# Patient Record
Sex: Female | Born: 1965
Health system: Southern US, Community
[De-identification: ages and names within clinical notes are randomized; demographics above are authoritative.]

## PROBLEM LIST (undated history)

## (undated) DIAGNOSIS — E781 Pure hyperglyceridemia: Secondary | ICD-10-CM

## (undated) DIAGNOSIS — M542 Cervicalgia: Secondary | ICD-10-CM

## (undated) DIAGNOSIS — E7800 Pure hypercholesterolemia, unspecified: Secondary | ICD-10-CM

## (undated) DIAGNOSIS — M1990 Unspecified osteoarthritis, unspecified site: Secondary | ICD-10-CM

## (undated) HISTORY — PX: ENDOMETRIAL ABLATION: SHX621

## (undated) HISTORY — DX: Pure hyperglyceridemia: E78.1

## (undated) HISTORY — DX: Pure hypercholesterolemia, unspecified: E78.00

## (undated) HISTORY — PX: AUGMENTATION MAMMAPLASTY: SUR837

## (undated) HISTORY — PX: TUBAL LIGATION: SHX77

## (undated) HISTORY — DX: Cervicalgia: M54.2

---

## 1994-01-18 HISTORY — PX: BREAST SURGERY: SHX581

## 2000-07-11 ENCOUNTER — Ambulatory Visit (HOSPITAL_COMMUNITY): Admission: RE | Admit: 2000-07-11 | Discharge: 2000-07-11 | Payer: Self-pay | Admitting: Obstetrics and Gynecology

## 2003-01-08 ENCOUNTER — Other Ambulatory Visit: Admission: RE | Admit: 2003-01-08 | Discharge: 2003-01-08 | Payer: Self-pay | Admitting: Obstetrics and Gynecology

## 2003-10-23 ENCOUNTER — Encounter: Admission: RE | Admit: 2003-10-23 | Discharge: 2003-10-23 | Payer: Self-pay | Admitting: Obstetrics and Gynecology

## 2004-01-21 ENCOUNTER — Other Ambulatory Visit: Admission: RE | Admit: 2004-01-21 | Discharge: 2004-01-21 | Payer: Self-pay | Admitting: Obstetrics and Gynecology

## 2004-04-20 ENCOUNTER — Encounter: Admission: RE | Admit: 2004-04-20 | Discharge: 2004-04-20 | Payer: Self-pay | Admitting: Obstetrics and Gynecology

## 2008-01-26 ENCOUNTER — Encounter: Payer: Self-pay | Admitting: Obstetrics and Gynecology

## 2008-01-26 ENCOUNTER — Ambulatory Visit: Payer: Self-pay | Admitting: Obstetrics and Gynecology

## 2008-01-26 ENCOUNTER — Other Ambulatory Visit: Admission: RE | Admit: 2008-01-26 | Discharge: 2008-01-26 | Payer: Self-pay | Admitting: Obstetrics and Gynecology

## 2008-02-06 ENCOUNTER — Encounter: Admission: RE | Admit: 2008-02-06 | Discharge: 2008-02-06 | Payer: Self-pay | Admitting: Obstetrics and Gynecology

## 2008-02-06 ENCOUNTER — Ambulatory Visit: Payer: Self-pay | Admitting: Obstetrics and Gynecology

## 2008-02-08 ENCOUNTER — Ambulatory Visit: Payer: Self-pay | Admitting: Obstetrics and Gynecology

## 2008-02-08 ENCOUNTER — Ambulatory Visit (HOSPITAL_BASED_OUTPATIENT_CLINIC_OR_DEPARTMENT_OTHER): Admission: RE | Admit: 2008-02-08 | Discharge: 2008-02-08 | Payer: Self-pay | Admitting: Obstetrics and Gynecology

## 2008-02-16 ENCOUNTER — Ambulatory Visit: Payer: Self-pay | Admitting: Obstetrics and Gynecology

## 2008-03-05 ENCOUNTER — Encounter: Admission: RE | Admit: 2008-03-05 | Discharge: 2008-03-05 | Payer: Self-pay | Admitting: Obstetrics and Gynecology

## 2008-12-03 ENCOUNTER — Ambulatory Visit: Payer: Self-pay | Admitting: Obstetrics and Gynecology

## 2008-12-26 ENCOUNTER — Ambulatory Visit: Payer: Self-pay | Admitting: Obstetrics and Gynecology

## 2009-01-09 ENCOUNTER — Ambulatory Visit: Payer: Self-pay | Admitting: Obstetrics and Gynecology

## 2009-01-29 ENCOUNTER — Ambulatory Visit: Payer: Self-pay | Admitting: Obstetrics and Gynecology

## 2009-03-25 ENCOUNTER — Ambulatory Visit: Payer: Self-pay | Admitting: Obstetrics and Gynecology

## 2009-03-25 ENCOUNTER — Other Ambulatory Visit: Admission: RE | Admit: 2009-03-25 | Discharge: 2009-03-25 | Payer: Self-pay | Admitting: Obstetrics and Gynecology

## 2009-07-07 ENCOUNTER — Encounter: Admission: RE | Admit: 2009-07-07 | Discharge: 2009-07-07 | Payer: Self-pay | Admitting: Obstetrics and Gynecology

## 2010-06-02 NOTE — Op Note (Signed)
NAMEJONIAH, Mary Watson                  ACCOUNT NO.:  [id number]   MEDICAL RECORD NO.:  [id number]          PATIENT TYPE:  AMB   LOCATION:  [facility]                         FACILITY:  [facility]   PHYSICIAN:  Daniel L. Gottsegen, M.D.DATE OF BIRTH:  04-Feb-1965   DATE OF PROCEDURE:  02/08/2008  DATE OF DISCHARGE:                               OPERATIVE REPORT   PREOPERATIVE DIAGNOSIS:  A right Bartholin cyst.   POSTOPERATIVE DIAGNOSIS:  A right Bartholin cyst.   OPERATIONS:  Marsupialization of right Bartholin cyst.   SURGEON:  Daniel L. Eda Paschal, M.D.   ANESTHESIA:  General.   INDICATIONS:  Patient is a 45 year old female who had presented to the  office with a 50-month history of a swelling of her right labia which was  very uncomfortable.  On presentation, it is a Bartholin cyst.  There was  no evidence of any skin cellulitis.  She enters the hospital now for  marsupialization of the above.   FINDINGS:  External exam is normal.  B U S reveals a right Bartholin  cyst of about 4 to 5 cm.  Vagina is normal.  Cervix is clean.  Uterus is  normal size and shape.  Adnexa failed to reveal masses.   PROCEDURE:  After adequate general anesthesia, the patient was placed in  the dorsal lithotomy position and prepped and draped in the usual  sterile manner.  At the mucocutaneous junction, an incision was made  over the Bartholin cyst and cyst fluid drained without any evidence of  abscess.  This cyst wall was fairly deep.  It was probed to be sure  there were no loculations and there were none.  A rectal exam was done  and there was no communication with the rectum.  The cyst wall was then  sutured to the skin with a running locking 3-0 Vicryl in a  circumferential fashion.  This controlled all bleeding.  Copious  irrigation was done with sterile saline.  Two sponge, needle, and  instrument counts were correct.  Blood loss was minimal.  The patient  left the operating room in satisfactory  condition.      Daniel L. Eda Paschal, M.D.  Electronically Signed     DLG/MEDQ  D:  02/08/2008  T:  02/08/2008  Job:  [id number]

## 2010-07-27 ENCOUNTER — Other Ambulatory Visit: Payer: Self-pay | Admitting: Obstetrics and Gynecology

## 2010-07-27 DIAGNOSIS — Z1231 Encounter for screening mammogram for malignant neoplasm of breast: Secondary | ICD-10-CM

## 2010-07-31 ENCOUNTER — Ambulatory Visit: Payer: Self-pay

## 2010-08-14 ENCOUNTER — Ambulatory Visit: Payer: Self-pay

## 2010-08-21 ENCOUNTER — Ambulatory Visit
Admission: RE | Admit: 2010-08-21 | Discharge: 2010-08-21 | Disposition: A | Discharge status: Home / Self Care | Payer: BC Managed Care – PPO | Source: Ambulatory Visit | Attending: Obstetrics and Gynecology | Admitting: Obstetrics and Gynecology

## 2010-08-21 DIAGNOSIS — Z1231 Encounter for screening mammogram for malignant neoplasm of breast: Secondary | ICD-10-CM

## 2011-01-26 ENCOUNTER — Ambulatory Visit (INDEPENDENT_AMBULATORY_CARE_PROVIDER_SITE_OTHER): Payer: Managed Care, Other (non HMO) | Admitting: Family Medicine

## 2011-01-26 DIAGNOSIS — F411 Generalized anxiety disorder: Secondary | ICD-10-CM

## 2011-01-28 ENCOUNTER — Ambulatory Visit: Payer: Self-pay | Admitting: Family Medicine

## 2011-05-06 ENCOUNTER — Other Ambulatory Visit: Payer: Self-pay | Admitting: Family Medicine

## 2011-05-06 ENCOUNTER — Telehealth: Payer: Self-pay

## 2011-05-06 NOTE — Telephone Encounter (Signed)
Patient requests Dr. Audria Nine to refill Alprezolam until next appointment, only has 3 left. Please call and let her know if this can be done.  Can be reached at work after 11am : [phone]

## 2011-05-06 NOTE — Telephone Encounter (Signed)
Please pull chart.  Mary Watson 

## 2011-05-10 NOTE — Telephone Encounter (Signed)
LMOM to call back

## 2011-05-10 NOTE — Telephone Encounter (Signed)
Chart pulled to PA 

## 2011-05-10 NOTE — Telephone Encounter (Signed)
Needs OV for more refills on Xanax, please schedule with Dr. Audria Nine

## 2011-05-10 NOTE — Telephone Encounter (Signed)
Spoke with patient and let her know that she will need to have an appointment with Dr. Audria Nine before rx can be filled.  Patient stated that she understood and would call and make an appointment later on today.

## 2011-05-13 ENCOUNTER — Ambulatory Visit: Payer: Managed Care, Other (non HMO) | Admitting: Family Medicine

## 2011-05-13 ENCOUNTER — Ambulatory Visit: Payer: Managed Care, Other (non HMO) | Admitting: Physician Assistant

## 2011-05-13 VITALS — BP 128/82 | HR 94 | Temp 98.2°F | Resp 16 | Ht 65.5 in | Wt 139.6 lb

## 2011-05-13 DIAGNOSIS — G4700 Insomnia, unspecified: Secondary | ICD-10-CM

## 2011-05-13 DIAGNOSIS — F411 Generalized anxiety disorder: Secondary | ICD-10-CM

## 2011-05-13 DIAGNOSIS — F419 Anxiety disorder, unspecified: Secondary | ICD-10-CM

## 2011-05-13 MED ORDER — SERTRALINE HCL 50 MG PO TABS: 75.0000 mg | ORAL_TABLET | Freq: Every day | ORAL | Status: DC

## 2011-05-13 MED ORDER — ALPRAZOLAM 0.5 MG PO TABS: 0.5000 mg | ORAL_TABLET | Freq: Every evening | ORAL | Status: DC | PRN

## 2011-05-13 NOTE — Progress Notes (Signed)
  Subjective:    Patient ID: Mary Watson, female    DOB: 28-Feb-1965, 46 y.o.   MRN: [id number]  HPI Ms. Dagley is here today for med refills.  She states that the Zoloft helps her to not "flip out" as much when she is under stress. She states that her life stressors have reduced, but that work is still hectic and is interested in possibly weaning off of this medication in the future.  She has no noticeable side effects.   Ms. Nearhood states that she is having to take 2 of her 0.5 mg xanax to get to sleep and that one no longer works.  She does not take them every night. She has been on them for approximately 6 months.  Ms. Trego continues to smoke approximately 1/4 ppd.  She wants to quit in the future but is not interested right now.  Review of Systems    as stated in HPI Objective:   Physical Exam  Constitutional: She appears well-developed and well-nourished. No distress.  Neck: Normal range of motion. No thyromegaly present.  Cardiovascular: Normal rate, regular rhythm and normal heart sounds.   Pulmonary/Chest: Effort normal and breath sounds normal. No respiratory distress.  Lymphadenopathy:    She has no cervical adenopathy.   Filed Vitals:   05/13/11 1325  BP: 128/82  Pulse: 94  Temp: 98.2 F (36.8 C)  Resp: 16           Assessment & Plan:   1. Insomnia  ALPRAZolam (XANAX) 0.5 MG tablet  2. Anxiety  sertraline (ZOLOFT) 50 MG tablet  supportive care, see patient instructions

## 2011-05-13 NOTE — Patient Instructions (Signed)
Please take 1 and 1/2 tablets of your zoloft for the next 2 weeks. If you do not like the way that you feel you may stop after 2 weeks.  Insomnia Insomnia is frequent trouble falling and/or staying asleep. Insomnia can be a long term problem or a short term problem. Both are common. Insomnia can be a short term problem when the wakefulness is related to a certain stress or worry. Long term insomnia is often related to ongoing stress during waking hours and/or poor sleeping habits. Overtime, sleep deprivation itself can make the problem worse. Every little thing feels more severe because you are overtired and your ability to cope is decreased. CAUSES   Stress, anxiety, and depression.   Poor sleeping habits.   Distractions such as TV in the bedroom.   Naps close to bedtime.   Engaging in emotionally charged conversations before bed.   Technical reading before sleep.   Alcohol and other sedatives. They may make the problem worse. They can hurt normal sleep patterns and normal dream activity.   Stimulants such as caffeine for several hours prior to bedtime.   Pain syndromes and shortness of breath can cause insomnia.   Exercise late at night.   Changing time zones may cause sleeping problems (jet lag).  It is sometimes helpful to have someone observe your sleeping patterns. They should look for periods of not breathing during the night (sleep apnea). They should also look to see how long those periods last. If you live alone or observers are uncertain, you can also be observed at a sleep clinic where your sleep patterns will be professionally monitored. Sleep apnea requires a checkup and treatment. Give your caregivers your medical history. Give your caregivers observations your family has made about your sleep.  SYMPTOMS   Not feeling rested in the morning.   Anxiety and restlessness at bedtime.   Difficulty falling and staying asleep.  TREATMENT   Your caregiver may prescribe  treatment for an underlying medical disorders. Your caregiver can give advice or help if you are using alcohol or other drugs for self-medication. Treatment of underlying problems will usually eliminate insomnia problems.   Medications can be prescribed for short time use. They are generally not recommended for lengthy use.   Over-the-counter sleep medicines are not recommended for lengthy use. They can be habit forming.   You can promote easier sleeping by making lifestyle changes such as:   Using relaxation techniques that help with breathing and reduce muscle tension.   Exercising earlier in the day.   Changing your diet and the time of your last meal. No night time snacks.   Establish a regular time to go to bed.   Counseling can help with stressful problems and worry.   Soothing music and white noise may be helpful if there are background noises you cannot remove.   Stop tedious detailed work at least one hour before bedtime.  HOME CARE INSTRUCTIONS   Keep a diary. Inform your caregiver about your progress. This includes any medication side effects. See your caregiver regularly. Take note of:   Times when you are asleep.   Times when you are awake during the night.   The quality of your sleep.   How you feel the next day.  This information will help your caregiver care for you.  Get out of bed if you are still awake after 15 minutes. Read or do some quiet activity. Keep the lights down. Wait until you feel sleepy  and go back to bed.   Keep regular sleeping and waking hours. Avoid naps.   Exercise regularly.   Avoid distractions at bedtime. Distractions include watching television or engaging in any intense or detailed activity like attempting to balance the household checkbook.   Develop a bedtime ritual. Keep a familiar routine of bathing, brushing your teeth, climbing into bed at the same time each night, listening to soothing music. Routines increase the success of  falling to sleep faster.   Use relaxation techniques. This can be using breathing and muscle tension release routines. It can also include visualizing peaceful scenes. You can also help control troubling or intruding thoughts by keeping your mind occupied with boring or repetitive thoughts like the old concept of counting sheep. You can make it more creative like imagining planting one beautiful flower after another in your backyard garden.   During your day, work to eliminate stress. When this is not possible use some of the previous suggestions to help reduce the anxiety that accompanies stressful situations.  MAKE SURE YOU:   Understand these instructions.   Will watch your condition.   Will get help right away if you are not doing well or get worse.  Document Released: 01/02/2000 Document Revised: 12/24/2010 Document Reviewed: 02/01/2007 [facility] Patient Information 2012 [place], [place].Anxiety and Panic Attacks Your caregiver has informed you that you are having an anxiety or panic attack. There may be many forms of this. Most of the time these attacks come suddenly and without warning. They come at any time of day, including periods of sleep, and at any time of life. They may be strong and unexplained. Although panic attacks are very scary, they are physically harmless. Sometimes the cause of your anxiety is not known. Anxiety is a protective mechanism of the body in its fight or flight mechanism. Most of these perceived danger situations are actually nonphysical situations (such as anxiety over losing a job). CAUSES  The causes of an anxiety or panic attack are many. Panic attacks may occur in otherwise healthy people given a certain set of circumstances. There may be a genetic cause for panic attacks. Some medications may also have anxiety as a side effect. SYMPTOMS  Some of the most common feelings are:  Intense terror.   Dizziness, feeling faint.   Hot and cold flashes.   Fear of  going crazy.   Feelings that nothing is real.   Sweating.   Shaking.   Chest pain or a fast heartbeat (palpitations).   Smothering, choking sensations.   Feelings of impending doom and that death is near.   Tingling of extremities, this may be from over-breathing.   Altered reality (derealization).   Being detached from yourself (depersonalization).  Several symptoms can be present to make up anxiety or panic attacks. DIAGNOSIS  The evaluation by your caregiver will depend on the type of symptoms you are experiencing. The diagnosis of anxiety or panic attack is made when no physical illness can be determined to be a cause of the symptoms. TREATMENT  Treatment to prevent anxiety and panic attacks may include:  Avoidance of circumstances that cause anxiety.   Reassurance and relaxation.   Regular exercise.   Relaxation therapies, such as yoga.   Psychotherapy with a psychiatrist or therapist.   Avoidance of caffeine, alcohol and illegal drugs.   Prescribed medication.  SEEK IMMEDIATE MEDICAL CARE IF:   You experience panic attack symptoms that are different than your usual symptoms.   You have any  worsening or concerning symptoms.  Document Released: 01/04/2005 Document Revised: 12/24/2010 Document Reviewed: 05/08/2009 [facility] Patient Information 2012 [place], [place].

## 2011-05-14 NOTE — Progress Notes (Signed)
Lengthy discussion regarding desire to reduce need for potentially habit-forming medication.  Patient agrees to try increase in sertraline to 75 mg.  If, after 2 weeks, she doesn't like how she feels on the higher dose, she can reduce it back to 50 mg.  I have examined this patient along with the student and agree.

## 2011-07-08 ENCOUNTER — Ambulatory Visit: Payer: Managed Care, Other (non HMO) | Admitting: Family Medicine

## 2012-06-28 ENCOUNTER — Other Ambulatory Visit: Payer: Self-pay

## 2012-06-28 DIAGNOSIS — Z1231 Encounter for screening mammogram for malignant neoplasm of breast: Secondary | ICD-10-CM

## 2012-08-02 ENCOUNTER — Ambulatory Visit
Admission: RE | Admit: 2012-08-02 | Discharge: 2012-08-02 | Disposition: A | Discharge status: Home / Self Care | Payer: Commercial Indemnity | Source: Ambulatory Visit

## 2012-08-02 ENCOUNTER — Ambulatory Visit (INDEPENDENT_AMBULATORY_CARE_PROVIDER_SITE_OTHER): Payer: Managed Care, Other (non HMO) | Admitting: Physician Assistant

## 2012-08-02 ENCOUNTER — Encounter: Payer: Self-pay | Admitting: Physician Assistant

## 2012-08-02 ENCOUNTER — Ambulatory Visit
Admission: RE | Admit: 2012-08-02 | Discharge: 2012-08-02 | Disposition: A | Discharge status: Home / Self Care | Payer: Managed Care, Other (non HMO) | Source: Ambulatory Visit | Attending: Physician Assistant | Admitting: Physician Assistant

## 2012-08-02 VITALS — BP 132/88 | HR 76 | Temp 98.3°F | Resp 18 | Ht 65.75 in | Wt 152.0 lb

## 2012-08-02 DIAGNOSIS — Z1231 Encounter for screening mammogram for malignant neoplasm of breast: Secondary | ICD-10-CM

## 2012-08-02 DIAGNOSIS — M542 Cervicalgia: Secondary | ICD-10-CM

## 2012-08-02 MED ORDER — HYDROCODONE-ACETAMINOPHEN 5-325 MG PO TABS: 1.0000 | ORAL_TABLET | Freq: Four times a day (QID) | ORAL | Status: DC | PRN

## 2012-08-02 MED ORDER — MELOXICAM 7.5 MG PO TABS: 7.5000 mg | ORAL_TABLET | Freq: Every day | ORAL | Status: DC
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
## 2012-08-07 ENCOUNTER — Other Ambulatory Visit: Payer: Self-pay | Admitting: Gynecology

## 2012-08-07 DIAGNOSIS — R928 Other abnormal and inconclusive findings on diagnostic imaging of breast: Secondary | ICD-10-CM

## 2012-08-09 ENCOUNTER — Encounter: Payer: Self-pay | Admitting: Physician Assistant

## 2012-08-09 ENCOUNTER — Ambulatory Visit (INDEPENDENT_AMBULATORY_CARE_PROVIDER_SITE_OTHER): Payer: Managed Care, Other (non HMO) | Admitting: Physician Assistant

## 2012-08-09 VITALS — BP 114/66 | HR 88 | Temp 98.2°F | Resp 20 | Wt 149.0 lb

## 2012-08-09 DIAGNOSIS — M542 Cervicalgia: Secondary | ICD-10-CM

## 2012-08-09 MED ORDER — HYDROCODONE-ACETAMINOPHEN 10-325 MG PO TABS: 1.0000 | ORAL_TABLET | Freq: Three times a day (TID) | ORAL | Status: DC | PRN

## 2012-08-09 NOTE — Progress Notes (Signed)
   Patient ID: Mary Watson MRN: [id number], DOB: 07/18/1965, 47 y.o. Date of Encounter: 08/09/2012, 3:15 PM    Chief Complaint:  Chief Complaint  Patient presents with  . here for xray results     HPI: 47 y.o. year old white female here to discuss XRays she had on cervical spine. No new complaints today. Just had recent OV reg neck pain. Those symptoms have not worsened/changed.  At that OV I Rxed Hydrocodone 5/325. She tried just taking one at a time but got very little relief with that. Hasended up taking two at 7 am, 2 at 1 pm, and 1 at 7pm.  Says it really wears off after about 4 hours but waits 6 hours before takes another dose. At night, able to sit in hot tub, use heating pad, relax so able to control pain with this and just one hydrocodone.      Home Meds: See attached medication section for any medications that were entered at today's visit. The computer does not put those onto this list.The following list is a list of meds entered prior to today's visit.   Current Outpatient Prescriptions on File Prior to Visit  Medication Sig Dispense Refill  . HYDROcodone-acetaminophen (NORCO/VICODIN) 5-325 MG per tablet Take 1 tablet by mouth every 6 (six) hours as needed for pain.  30 tablet  0  . meloxicam (MOBIC) 7.5 MG tablet Take 1 tablet (7.5 mg total) by mouth daily.  30 tablet  0  . Multiple Vitamin (MULTIVITAMIN) tablet Take 1 tablet by mouth daily.       No current facility-administered medications on file prior to visit.    Allergies:  Allergies  Allergen Reactions  . Ciprofloxacin     vomiting      Review of Systems: See HPI for pertinent ROS. All other ROS negative.    Physical Exam: Blood pressure 114/66, pulse 88, temperature 98.2 F (36.8 C), temperature source Oral, resp. rate 20, weight 149 lb (67.586 kg), last menstrual period 07/22/2012., Body mass index is 24.23 kg/(m^2). General:  Appears in no acute distress. Neck: Supple. No thyromegaly. No  lymphadenopathy. Lungs: Clear bilaterally to auscultation without wheezes, rales, or rhonchi. Breathing is unlabored. Heart: Regular rhythm. No murmurs, rubs, or gallops. Msk:  Strength and tone normal for age. Tenderness with palpation bilateral neck.  Extremities/Skin: Warm and dry. No clubbing or cyanosis. No edema. No rashes or suspicious lesions. Neuro: Alert and oriented X 3. Moves all extremities spontaneously. Gait is normal. CNII-XII grossly in tact. Psych:  Responds to questions appropriately with a normal affect.     ASSESSMENT AND PLAN:  47 y.o. year old female with  1. Neck pain I discussed XRay report and gave pt print out of report.  Will refer to spine specialist. They can obtain further imaging if needed.  - Ambulatory referral to Orthopedic Surgery - HYDROcodone-acetaminophen (NORCO) 10-325 MG per tablet; Take 1 tablet by mouth every 8 (eight) hours as needed for pain.  Dispense: 60 tablet; Refill: 0   Signed, [address] [place], [place], [facility] 08/09/2012 3:15 PM

## 2012-08-11 ENCOUNTER — Ambulatory Visit
Admission: RE | Admit: 2012-08-11 | Discharge: 2012-08-11 | Disposition: A | Discharge status: Home / Self Care | Payer: Managed Care, Other (non HMO) | Source: Ambulatory Visit | Attending: Gynecology | Admitting: Gynecology

## 2012-08-11 DIAGNOSIS — R928 Other abnormal and inconclusive findings on diagnostic imaging of breast: Secondary | ICD-10-CM

## 2012-09-06 ENCOUNTER — Encounter (HOSPITAL_COMMUNITY): Payer: Self-pay

## 2012-09-06 ENCOUNTER — Emergency Department (HOSPITAL_COMMUNITY)
Admission: EM | Admit: 2012-09-06 | Discharge: 2012-09-06 | Disposition: A | Discharge status: Home / Self Care | Payer: Managed Care, Other (non HMO) | Attending: Emergency Medicine | Admitting: Emergency Medicine

## 2012-09-06 DIAGNOSIS — F17200 Nicotine dependence, unspecified, uncomplicated: Secondary | ICD-10-CM | POA: Insufficient documentation

## 2012-09-06 DIAGNOSIS — IMO0001 Reserved for inherently not codable concepts without codable children: Secondary | ICD-10-CM | POA: Insufficient documentation

## 2012-09-06 DIAGNOSIS — G8929 Other chronic pain: Secondary | ICD-10-CM

## 2012-09-06 MED ORDER — KETOROLAC TROMETHAMINE 30 MG/ML IJ SOLN
30.0000 mg | Freq: Once | INTRAMUSCULAR | Status: AC
  Administered 2012-09-06: 30 mg via INTRAMUSCULAR
  Filled 2012-09-06: qty 1
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
## 2012-09-10 NOTE — ED Provider Notes (Signed)
Medical screening examination/treatment/procedure(s) were performed by non-physician practitioner and as supervising physician I was immediately available for consultation/collaboration.   Enid Skeens, MD 09/10/12 [phone]

## 2012-11-18 HISTORY — PX: CERVICAL DISCECTOMY: SHX98

## 2012-12-13 ENCOUNTER — Encounter (HOSPITAL_COMMUNITY): Payer: Self-pay | Admitting: Emergency Medicine

## 2012-12-13 ENCOUNTER — Emergency Department (HOSPITAL_COMMUNITY)
Admission: EM | Admit: 2012-12-13 | Discharge: 2012-12-13 | Disposition: A | Discharge status: Home / Self Care | Payer: Managed Care, Other (non HMO) | Attending: Emergency Medicine | Admitting: Emergency Medicine

## 2012-12-13 DIAGNOSIS — M542 Cervicalgia: Secondary | ICD-10-CM | POA: Insufficient documentation

## 2012-12-13 DIAGNOSIS — F17200 Nicotine dependence, unspecified, uncomplicated: Secondary | ICD-10-CM | POA: Insufficient documentation

## 2012-12-13 DIAGNOSIS — G8918 Other acute postprocedural pain: Secondary | ICD-10-CM | POA: Insufficient documentation

## 2012-12-13 MED ORDER — IBUPROFEN 800 MG PO TABS
800.0000 mg | ORAL_TABLET | Freq: Once | ORAL | Status: AC
  Administered 2012-12-13: 800 mg via ORAL
  Filled 2012-12-13: qty 1

## 2012-12-13 MED ORDER — IBUPROFEN 800 MG PO TABS: 800.0000 mg | ORAL_TABLET | Freq: Three times a day (TID) | ORAL | Status: DC

## 2012-12-13 NOTE — ED Provider Notes (Signed)
CSN: [id number]     Arrival date & time 12/13/12  1230 History   First MD Initiated Contact with Patient 12/13/12 1254     Chief Complaint  Patient presents with  . Neck Pain   (Consider location/radiation/quality/duration/timing/severity/associated sxs/prior Treatment) HPI She complains of nonradiating posterior neck pain onset today after having cervical fusion 6 days ago. She's been treated with oxycodone 15 mg and diazepam, without adequate pain relief. Nothing makes symptoms better or worse pain is constant. She's also been wearing a hard cervical collar with partial relief. She denies fever denies dysphagia denies focal numbness or weakness no other complaint Past Medical History  Diagnosis Date  . Neck pain    Past Surgical History  Procedure Laterality Date  . Tubal ligation    . Breast surgery  1996    augmentation   Family History  Problem Relation Age of Onset  . Arthritis Mother   . Cancer Mother   . Hyperlipidemia Father   . Hypertension Father   . Diabetes Father    History  Substance Use Topics  . Smoking status: Current Some Day Smoker -- 0.50 packs/day    Types: Cigarettes  . Smokeless tobacco: Never Used  . Alcohol Use: No   ex-smoker quit 2 months ago OB History   Grav Para Term Preterm Abortions TAB SAB Ect Mult Living                 Review of Systems  Musculoskeletal: Positive for neck pain.  All other systems reviewed and are negative.    Allergies  Ciprofloxacin  Home Medications   Current Outpatient Rx  Name  Route  Sig  Dispense  Refill  . diazepam (VALIUM) 5 MG tablet   Oral   Take 5 mg by mouth every 8 (eight) hours as needed. For muscle spasms         . gabapentin (NEURONTIN) 300 MG capsule   Oral   Take 300 mg by mouth 3 (three) times daily as needed. For pain         . Multiple Vitamin (MULTIVITAMIN) tablet   Oral   Take 1 tablet by mouth daily.         Marland Kitchen oxyCODONE (ROXICODONE) 15 MG immediate release tablet  Oral   Take 15 mg by mouth every 4 (four) hours as needed. For pain          BP 115/84  Pulse 120  Temp(Src) 98.1 F (36.7 C) (Oral)  Resp 18  Ht 5\' 5"  (1.651 m)  Wt 154 lb 8 oz (70.081 kg)  BMI 25.71 kg/m2  SpO2 96% Physical Exam  Nursing note and vitals reviewed. Constitutional: She appears well-developed and well-nourished.  HENT:  Head: Normocephalic and atraumatic.  Eyes: Conjunctivae are normal. Pupils are equal, round, and reactive to light.  Neck: Neck supple. No tracheal deviation present. No thyromegaly present.  Well-healed surgical wound anteriorly. No point tenderness  Cardiovascular: Normal rate and regular rhythm.   No murmur heard. Heart rate counted 92 by me.  Pulmonary/Chest: Effort normal and breath sounds normal.  Abdominal: Soft. Bowel sounds are normal. She exhibits no distension. There is no tenderness.  Musculoskeletal: Normal range of motion. She exhibits no edema and no tenderness.  Neurological: She is alert. No cranial nerve deficit. She exhibits normal muscle tone. Coordination normal.  Motor strength 5 over 5 overall. DTRs symmetric bilaterally knee jerk and biceps toes are normal bilaterally gait normal  Skin: Skin is warm and dry.  No rash noted.  Psychiatric: She has a normal mood and affect.    ED Course  Procedures (including critical care time) Labs Review Labs Reviewed - No data to display Imaging Review No results found.  EKG Interpretation   None       MDM  No diagnosis found. No signs of infection. Spoke with Dr. Lovell Sheehan No further ED workup needed. Prescription ibuprofen. He will call her later this evening to check on her Diagnosis postoperative pain    Doug Sou, MD 12/13/12 1345

## 2012-12-13 NOTE — ED Notes (Signed)
Pt reports having neck surgery on 11/20, is having constant pain to back of neck since surgery. It is not relieved by her pain meds. Ambulatory at triage, no acute distress noted.

## 2013-02-05 ENCOUNTER — Telehealth: Payer: Self-pay | Admitting: *Deleted

## 2013-02-05 ENCOUNTER — Telehealth: Payer: Self-pay | Admitting: Physician Assistant

## 2013-02-05 DIAGNOSIS — M542 Cervicalgia: Secondary | ICD-10-CM

## 2013-02-05 NOTE — Telephone Encounter (Signed)
Call back number is [phone] Pt is needing a referral to the [facility] (fax number is [phone]) [place]

## 2013-02-05 NOTE — Telephone Encounter (Signed)
referral initiated

## 2013-02-05 NOTE — Telephone Encounter (Signed)
Pt chart show dismissed patient.  Patient told must speak to billing office.

## 2013-02-09 ENCOUNTER — Encounter: Payer: Self-pay | Admitting: Family Medicine

## 2013-05-10 ENCOUNTER — Encounter: Payer: Self-pay | Admitting: Family Medicine

## 2013-06-19 ENCOUNTER — Encounter: Payer: Self-pay | Admitting: Family Medicine

## 2013-06-19 DIAGNOSIS — M7918 Myalgia, other site: Secondary | ICD-10-CM | POA: Insufficient documentation

## 2013-06-19 DIAGNOSIS — M792 Neuralgia and neuritis, unspecified: Secondary | ICD-10-CM | POA: Insufficient documentation

## 2013-06-19 DIAGNOSIS — M542 Cervicalgia: Secondary | ICD-10-CM | POA: Insufficient documentation

## 2013-06-19 DIAGNOSIS — G894 Chronic pain syndrome: Secondary | ICD-10-CM | POA: Insufficient documentation

## 2013-06-19 DIAGNOSIS — F1190 Opioid use, unspecified, uncomplicated: Secondary | ICD-10-CM | POA: Insufficient documentation

## 2013-06-19 DIAGNOSIS — M5412 Radiculopathy, cervical region: Secondary | ICD-10-CM | POA: Insufficient documentation

## 2013-07-13 ENCOUNTER — Other Ambulatory Visit: Payer: Self-pay

## 2013-07-13 ENCOUNTER — Encounter: Payer: Self-pay | Admitting: Physician Assistant

## 2013-07-13 DIAGNOSIS — Z1231 Encounter for screening mammogram for malignant neoplasm of breast: Secondary | ICD-10-CM

## 2013-08-07 ENCOUNTER — Encounter (INDEPENDENT_AMBULATORY_CARE_PROVIDER_SITE_OTHER): Payer: Self-pay

## 2013-08-07 ENCOUNTER — Ambulatory Visit
Admission: RE | Admit: 2013-08-07 | Discharge: 2013-08-07 | Disposition: A | Discharge status: Home / Self Care | Payer: Managed Care, Other (non HMO) | Source: Ambulatory Visit

## 2013-08-07 DIAGNOSIS — Z1231 Encounter for screening mammogram for malignant neoplasm of breast: Secondary | ICD-10-CM

## 2013-09-30 ENCOUNTER — Emergency Department (HOSPITAL_COMMUNITY)
Admission: EM | Admit: 2013-09-30 | Discharge: 2013-10-01 | Disposition: A | Discharge status: Home / Self Care | Payer: Managed Care, Other (non HMO) | Attending: Emergency Medicine | Admitting: Emergency Medicine

## 2013-09-30 ENCOUNTER — Encounter (HOSPITAL_COMMUNITY): Payer: Self-pay | Admitting: Emergency Medicine

## 2013-09-30 DIAGNOSIS — G8929 Other chronic pain: Secondary | ICD-10-CM | POA: Insufficient documentation

## 2013-09-30 DIAGNOSIS — Z760 Encounter for issue of repeat prescription: Secondary | ICD-10-CM | POA: Insufficient documentation

## 2013-09-30 DIAGNOSIS — F17200 Nicotine dependence, unspecified, uncomplicated: Secondary | ICD-10-CM | POA: Diagnosis not present

## 2013-09-30 DIAGNOSIS — Z791 Long term (current) use of non-steroidal anti-inflammatories (NSAID): Secondary | ICD-10-CM | POA: Insufficient documentation

## 2013-09-30 DIAGNOSIS — Z79899 Other long term (current) drug therapy: Secondary | ICD-10-CM | POA: Insufficient documentation

## 2013-09-30 MED ORDER — OXYCODONE-ACETAMINOPHEN 5-325 MG PO TABS
2.0000 | ORAL_TABLET | Freq: Once | ORAL | Status: AC
  Administered 2013-09-30: 2 via ORAL
  Filled 2013-09-30: qty 2
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
## 2013-10-01 NOTE — ED Provider Notes (Signed)
Medical screening examination/treatment/procedure(s) were performed by non-physician practitioner and as supervising physician I was immediately available for consultation/collaboration.   EKG Interpretation None        Tomasita Crumble, MD 10/01/13 [phone]

## 2013-10-01 NOTE — Discharge Instructions (Signed)
Follow up with your pain management clinic. Return to the ED for new concerns.

## 2014-05-22 ENCOUNTER — Other Ambulatory Visit: Payer: Self-pay | Admitting: Neurosurgery

## 2014-05-22 DIAGNOSIS — M542 Cervicalgia: Secondary | ICD-10-CM

## 2014-05-23 ENCOUNTER — Ambulatory Visit
Admission: RE | Admit: 2014-05-23 | Discharge: 2014-05-23 | Disposition: A | Discharge status: Home / Self Care | Payer: Managed Care, Other (non HMO) | Source: Ambulatory Visit | Attending: Neurosurgery | Admitting: Neurosurgery

## 2014-05-23 DIAGNOSIS — M542 Cervicalgia: Secondary | ICD-10-CM

## 2014-06-07 ENCOUNTER — Other Ambulatory Visit: Payer: Self-pay | Admitting: Neurosurgery

## 2014-06-19 ENCOUNTER — Inpatient Hospital Stay (HOSPITAL_COMMUNITY): Admission: RE | Admit: 2014-06-19 | Payer: Managed Care, Other (non HMO) | Source: Ambulatory Visit

## 2014-07-05 ENCOUNTER — Encounter (HOSPITAL_COMMUNITY): Payer: Self-pay

## 2014-07-05 ENCOUNTER — Encounter (HOSPITAL_COMMUNITY)
Admission: RE | Admit: 2014-07-05 | Discharge: 2014-07-05 | Disposition: A | Discharge status: Home / Self Care | Payer: Managed Care, Other (non HMO) | Source: Ambulatory Visit | Attending: Neurosurgery | Admitting: Neurosurgery

## 2014-07-05 DIAGNOSIS — M4712 Other spondylosis with myelopathy, cervical region: Secondary | ICD-10-CM | POA: Diagnosis not present

## 2014-07-05 DIAGNOSIS — Z01812 Encounter for preprocedural laboratory examination: Secondary | ICD-10-CM | POA: Insufficient documentation

## 2014-07-05 HISTORY — DX: Unspecified osteoarthritis, unspecified site: M19.90

## 2014-07-05 LAB — CBC
HCT: 39.3 % (ref 36.0–46.0)
Hemoglobin: 13.7 g/dL (ref 12.0–15.0)
MCH: 30.6 pg (ref 26.0–34.0)
MCHC: 34.9 g/dL (ref 30.0–36.0)
MCV: 87.7 fL (ref 78.0–100.0)
Platelets: 254 10*3/uL (ref 150–400)
RBC: 4.48 MIL/uL (ref 3.87–5.11)
RDW: 12.2 % (ref 11.5–15.5)
WBC: 9.7 10*3/uL (ref 4.0–10.5)

## 2014-07-05 LAB — SURGICAL PCR SCREEN
MRSA, PCR: NEGATIVE
Staphylococcus aureus: NEGATIVE

## 2014-07-05 LAB — HCG, SERUM, QUALITATIVE: Preg, Serum: NEGATIVE

## 2014-07-05 NOTE — Pre-Procedure Instructions (Addendum)
Fardosa Stauter  07/05/2014      [facility] [address], [place] - [address] [place] [address] Phone: [phone] Fax: [phone]  [facility] - [place], [place] - [address] [place] [address] Phone: [phone] Fax: [phone]    Your procedure is scheduled on 07/10/14.  Report to [facility] short stay admitting at 916 A.M.  Call this number if you have problems the morning of surgery:  [phone]   Remember:  Do not eat food or drink liquids after midnight.  Take these medicines the morning of surgery with A SIP OF WATER none    STOP all herbel meds, nsaids (aleve,naproxen,advil,ibuprofen)  Starting now including aspirin, vitamins   Do not wear jewelry, make-up or nail polish.  Do not wear lotions, powders, or perfumes.  You may wear deodorant.  Do not shave 48 hours prior to surgery.  Men may shave face and neck.  Do not bring valuables to the hospital.  [facility] is not responsible for any belongings or valuables.  Contacts, dentures or bridgework may not be worn into surgery.  Leave your suitcase in the car.  After surgery it may be brought to your room.  For patients admitted to the hospital, discharge time will be determined by your treatment team.  Patients discharged the day of surgery will not be allowed to drive home.   Name and phone number of your driver:    Special instructions:   Special Instructions: [facility] [facility] - Preparing for Surgery  Before surgery, you can play an important role.  Because skin is not sterile, your skin needs to be as free of germs as possible.  You can reduce the number of germs on you skin by washing with CHG (chlorahexidine gluconate) soap before surgery.  CHG is an antiseptic cleaner which kills germs and bonds with the skin to continue killing germs even after washing.  Please DO NOT use if you have an allergy to CHG or antibacterial soaps.  If  your skin becomes reddened/irritated stop using the CHG and inform your nurse when you arrive at Short Stay.  Do not shave (including legs and underarms) for at least 48 hours prior to the first CHG shower.  You may shave your face.  Please follow these instructions carefully:   1.  Shower with CHG Soap the night before surgery and the morning of Surgery.  2.  If you choose to wash your hair, wash your hair first as usual with your normal shampoo.  3.  After you shampoo, rinse your hair and body thoroughly to remove the Shampoo.  4.  Use CHG as you would any other liquid soap.  You can apply chg directly  to the skin and wash gently with scrungie or a clean washcloth.  5.  Apply the CHG Soap to your body ONLY FROM THE NECK DOWN.  Do not use on open wounds or open sores.  Avoid contact with your eyes ears, mouth and genitals (private parts).  Wash genitals (private parts)       with your normal soap.  6.  Wash thoroughly, paying special attention to the area where your surgery will be performed.  7.  Thoroughly rinse your body with warm water from the neck down.  8.  DO NOT shower/wash with your normal soap after using and rinsing off the CHG Soap.  9.  Pat yourself dry with a clean towel.  10.  Wear clean pajamas.            11.  Place clean sheets on your bed the night of your first shower and do not sleep with pets.  Day of Surgery  Do not apply any lotions/deodorants the morning of surgery.  Please wear clean clothes to the hospital/surgery center.  Please read over the following fact sheets that you were given. Pain Booklet, Coughing and Deep Breathing, MRSA Information and Surgical Site Infection Prevention

## 2014-07-09 MED ORDER — CEFAZOLIN SODIUM-DEXTROSE 2-3 GM-% IV SOLR
2.0000 g | INTRAVENOUS | Status: AC
  Administered 2014-07-10: 2 g via INTRAVENOUS

## 2014-07-10 ENCOUNTER — Inpatient Hospital Stay (HOSPITAL_COMMUNITY)
Admission: RE | Admit: 2014-07-10 | Discharge: 2014-07-11 | DRG: 473 | Disposition: A | Discharge status: Home / Self Care | Payer: Managed Care, Other (non HMO) | Source: Ambulatory Visit | Attending: Neurosurgery | Admitting: Neurosurgery

## 2014-07-10 ENCOUNTER — Inpatient Hospital Stay (HOSPITAL_COMMUNITY): Payer: Managed Care, Other (non HMO) | Admitting: Certified Registered Nurse Anesthetist

## 2014-07-10 ENCOUNTER — Encounter (HOSPITAL_COMMUNITY): Payer: Self-pay | Admitting: Certified Registered Nurse Anesthetist

## 2014-07-10 ENCOUNTER — Inpatient Hospital Stay (HOSPITAL_COMMUNITY): Payer: Managed Care, Other (non HMO)

## 2014-07-10 ENCOUNTER — Encounter (HOSPITAL_COMMUNITY)
Admission: RE | Disposition: A | Discharge status: Home / Self Care | Payer: Self-pay | Source: Ambulatory Visit | Attending: Neurosurgery

## 2014-07-10 DIAGNOSIS — S129XXA Fracture of neck, unspecified, initial encounter: Secondary | ICD-10-CM | POA: Diagnosis present

## 2014-07-10 DIAGNOSIS — Z419 Encounter for procedure for purposes other than remedying health state, unspecified: Secondary | ICD-10-CM

## 2014-07-10 DIAGNOSIS — F17210 Nicotine dependence, cigarettes, uncomplicated: Secondary | ICD-10-CM | POA: Diagnosis present

## 2014-07-10 DIAGNOSIS — Y838 Other surgical procedures as the cause of abnormal reaction of the patient, or of later complication, without mention of misadventure at the time of the procedure: Secondary | ICD-10-CM | POA: Diagnosis present

## 2014-07-10 DIAGNOSIS — M960 Pseudarthrosis after fusion or arthrodesis: Principal | ICD-10-CM | POA: Diagnosis present

## 2014-07-10 DIAGNOSIS — Z881 Allergy status to other antibiotic agents status: Secondary | ICD-10-CM

## 2014-07-10 DIAGNOSIS — M542 Cervicalgia: Secondary | ICD-10-CM | POA: Diagnosis present

## 2014-07-10 DIAGNOSIS — Z79899 Other long term (current) drug therapy: Secondary | ICD-10-CM | POA: Diagnosis not present

## 2014-07-10 HISTORY — PX: POSTERIOR CERVICAL FUSION/FORAMINOTOMY: SHX5038

## 2014-07-10 SURGERY — POSTERIOR CERVICAL FUSION/FORAMINOTOMY LEVEL 2
Anesthesia: General | Site: Neck

## 2014-07-10 MED ORDER — PHENYLEPHRINE HCL 10 MG/ML IJ SOLN
INTRAMUSCULAR | Status: DC | PRN
  Administered 2014-07-10: 80 ug via INTRAVENOUS
  Administered 2014-07-10: 40 ug via INTRAVENOUS

## 2014-07-10 MED ORDER — THROMBIN 5000 UNITS EX SOLR
CUTANEOUS | Status: DC | PRN
  Administered 2014-07-10 (×2): 5000 [IU] via TOPICAL

## 2014-07-10 MED ORDER — ACETAMINOPHEN 650 MG RE SUPP: 650.0000 mg | RECTAL | Status: DC | PRN

## 2014-07-10 MED ORDER — PHENYLEPHRINE HCL 10 MG/ML IJ SOLN
10.0000 mg | INTRAMUSCULAR | Status: DC | PRN
  Administered 2014-07-10: 5 ug/min via INTRAVENOUS

## 2014-07-10 MED ORDER — HYDROMORPHONE HCL 1 MG/ML IJ SOLN: 1.0000 mg | INTRAMUSCULAR | Status: DC | PRN

## 2014-07-10 MED ORDER — FENTANYL CITRATE (PF) 100 MCG/2ML IJ SOLN
INTRAMUSCULAR | Status: DC | PRN
  Administered 2014-07-10: 50 ug via INTRAVENOUS
  Administered 2014-07-10 (×2): 100 ug via INTRAVENOUS

## 2014-07-10 MED ORDER — MENTHOL 3 MG MT LOZG: 1.0000 | LOZENGE | OROMUCOSAL | Status: DC | PRN

## 2014-07-10 MED ORDER — DOCUSATE SODIUM 100 MG PO CAPS: 100.0000 mg | ORAL_CAPSULE | Freq: Two times a day (BID) | ORAL | Status: DC

## 2014-07-10 MED ORDER — GLYCOPYRROLATE 0.2 MG/ML IJ SOLN
INTRAMUSCULAR | Status: AC
  Filled 2014-07-10: qty 2

## 2014-07-10 MED ORDER — ROCURONIUM BROMIDE 100 MG/10ML IV SOLN
INTRAVENOUS | Status: DC | PRN
  Administered 2014-07-10: 50 mg via INTRAVENOUS

## 2014-07-10 MED ORDER — PROPOFOL 10 MG/ML IV BOLUS
INTRAVENOUS | Status: DC | PRN
  Administered 2014-07-10: 40 mg via INTRAVENOUS
  Administered 2014-07-10: 200 mg via INTRAVENOUS

## 2014-07-10 MED ORDER — HYDROMORPHONE HCL 1 MG/ML IJ SOLN
INTRAMUSCULAR | Status: AC
  Administered 2014-07-10: 1 mg
  Filled 2014-07-10: qty 1

## 2014-07-10 MED ORDER — HEMOSTATIC AGENTS (NO CHARGE) OPTIME
TOPICAL | Status: DC | PRN
  Administered 2014-07-10: 1 via TOPICAL

## 2014-07-10 MED ORDER — BISACODYL 10 MG RE SUPP: 10.0000 mg | Freq: Every day | RECTAL | Status: DC | PRN

## 2014-07-10 MED ORDER — ONDANSETRON HCL 4 MG/2ML IJ SOLN
INTRAMUSCULAR | Status: DC | PRN
  Administered 2014-07-10 (×2): 4 mg via INTRAVENOUS

## 2014-07-10 MED ORDER — METHADONE HCL 10 MG/ML PO CONC: 10.0000 mg | Freq: Three times a day (TID) | ORAL | Status: DC

## 2014-07-10 MED ORDER — NEOSTIGMINE METHYLSULFATE 10 MG/10ML IV SOLN
INTRAVENOUS | Status: AC
  Filled 2014-07-10: qty 1

## 2014-07-10 MED ORDER — 0.9 % SODIUM CHLORIDE (POUR BTL) OPTIME
TOPICAL | Status: DC | PRN
  Administered 2014-07-10: 1000 mL

## 2014-07-10 MED ORDER — ACETAMINOPHEN 325 MG PO TABS: 650.0000 mg | ORAL_TABLET | ORAL | Status: DC | PRN

## 2014-07-10 MED ORDER — LIDOCAINE HCL 4 % MT SOLN
OROMUCOSAL | Status: DC | PRN
  Administered 2014-07-10: 4 mL via TOPICAL

## 2014-07-10 MED ORDER — LIDOCAINE HCL (CARDIAC) 20 MG/ML IV SOLN
INTRAVENOUS | Status: DC | PRN
  Administered 2014-07-10: 100 mg via INTRAVENOUS

## 2014-07-10 MED ORDER — GABAPENTIN 600 MG PO TABS
600.0000 mg | ORAL_TABLET | Freq: Every day | ORAL | Status: DC
  Administered 2014-07-10: 600 mg via ORAL
  Filled 2014-07-10 (×2): qty 1

## 2014-07-10 MED ORDER — FLEET ENEMA 7-19 GM/118ML RE ENEM
1.0000 | ENEMA | Freq: Once | RECTAL | Status: AC | PRN
  Filled 2014-07-10: qty 1

## 2014-07-10 MED ORDER — GABAPENTIN ENACARBIL ER 600 MG PO TBCR
600.0000 mg | EXTENDED_RELEASE_TABLET | Freq: Every day | ORAL | Status: DC
Start: 2014-07-10 — End: 2014-07-10

## 2014-07-10 MED ORDER — PROPOFOL 10 MG/ML IV BOLUS
INTRAVENOUS | Status: AC
  Filled 2014-07-10: qty 20

## 2014-07-10 MED ORDER — ONDANSETRON HCL 4 MG/2ML IJ SOLN: 4.0000 mg | Freq: Once | INTRAMUSCULAR | Status: DC | PRN

## 2014-07-10 MED ORDER — LACTATED RINGERS IV SOLN
INTRAVENOUS | Status: DC
  Administered 2014-07-10 (×2): via INTRAVENOUS

## 2014-07-10 MED ORDER — NEOSTIGMINE METHYLSULFATE 10 MG/10ML IV SOLN
INTRAVENOUS | Status: DC | PRN
  Administered 2014-07-10: 3 mg via INTRAVENOUS

## 2014-07-10 MED ORDER — DEXAMETHASONE SODIUM PHOSPHATE 4 MG/ML IJ SOLN
INTRAMUSCULAR | Status: DC | PRN
  Administered 2014-07-10: 4 mg via INTRAVENOUS

## 2014-07-10 MED ORDER — BUPIVACAINE-EPINEPHRINE (PF) 0.5% -1:200000 IJ SOLN
INTRAMUSCULAR | Status: DC | PRN
  Administered 2014-07-10: 10 mL

## 2014-07-10 MED ORDER — BUPIVACAINE LIPOSOME 1.3 % IJ SUSP
INTRAMUSCULAR | Status: DC | PRN
  Administered 2014-07-10: 20 mL

## 2014-07-10 MED ORDER — BACITRACIN ZINC 500 UNIT/GM EX OINT
TOPICAL_OINTMENT | CUTANEOUS | Status: DC | PRN
  Administered 2014-07-10 (×2): 1 via TOPICAL

## 2014-07-10 MED ORDER — PHENOL 1.4 % MT LIQD: 1.0000 | OROMUCOSAL | Status: DC | PRN

## 2014-07-10 MED ORDER — HYDROMORPHONE HCL 1 MG/ML IJ SOLN
0.5000 mg | INTRAMUSCULAR | Status: AC | PRN
  Administered 2014-07-10 (×4): 0.5 mg via INTRAVENOUS

## 2014-07-10 MED ORDER — FENTANYL CITRATE (PF) 250 MCG/5ML IJ SOLN
INTRAMUSCULAR | Status: AC
  Filled 2014-07-10: qty 5

## 2014-07-10 MED ORDER — MORPHINE SULFATE 2 MG/ML IJ SOLN
1.0000 mg | INTRAMUSCULAR | Status: DC | PRN
  Administered 2014-07-11: 2 mg via INTRAVENOUS
  Filled 2014-07-10: qty 1

## 2014-07-10 MED ORDER — ONDANSETRON HCL 4 MG/2ML IJ SOLN: 4.0000 mg | INTRAMUSCULAR | Status: DC | PRN

## 2014-07-10 MED ORDER — LACTATED RINGERS IV SOLN: INTRAVENOUS | Status: DC

## 2014-07-10 MED ORDER — OXYCODONE-ACETAMINOPHEN 5-325 MG PO TABS
1.0000 | ORAL_TABLET | ORAL | Status: DC | PRN
  Administered 2014-07-10 – 2014-07-11 (×4): 2 via ORAL
  Filled 2014-07-10 (×4): qty 2

## 2014-07-10 MED ORDER — CEFAZOLIN SODIUM-DEXTROSE 2-3 GM-% IV SOLR
2.0000 g | Freq: Three times a day (TID) | INTRAVENOUS | Status: AC
  Administered 2014-07-11: 2 g via INTRAVENOUS
  Filled 2014-07-10 (×2): qty 50

## 2014-07-10 MED ORDER — HYDROCODONE-ACETAMINOPHEN 5-325 MG PO TABS: 1.0000 | ORAL_TABLET | ORAL | Status: DC | PRN

## 2014-07-10 MED ORDER — ADULT MULTIVITAMIN W/MINERALS CH
1.0000 | ORAL_TABLET | Freq: Every day | ORAL | Status: DC
  Filled 2014-07-10: qty 1

## 2014-07-10 MED ORDER — DEXAMETHASONE SODIUM PHOSPHATE 4 MG/ML IJ SOLN
INTRAMUSCULAR | Status: AC
  Filled 2014-07-10: qty 1

## 2014-07-10 MED ORDER — ALUM & MAG HYDROXIDE-SIMETH 200-200-20 MG/5ML PO SUSP: 30.0000 mL | Freq: Four times a day (QID) | ORAL | Status: DC | PRN

## 2014-07-10 MED ORDER — ONDANSETRON HCL 4 MG/2ML IJ SOLN
INTRAMUSCULAR | Status: AC
  Filled 2014-07-10: qty 4

## 2014-07-10 MED ORDER — GLYCOPYRROLATE 0.2 MG/ML IJ SOLN
INTRAMUSCULAR | Status: DC | PRN
  Administered 2014-07-10: .4 mg via INTRAVENOUS

## 2014-07-10 MED ORDER — BUPIVACAINE LIPOSOME 1.3 % IJ SUSP
20.0000 mL | Freq: Once | INTRAMUSCULAR | Status: DC
  Filled 2014-07-10: qty 20

## 2014-07-10 MED ORDER — HYDROMORPHONE HCL 1 MG/ML IJ SOLN
INTRAMUSCULAR | Status: AC
  Filled 2014-07-10: qty 2

## 2014-07-10 MED ORDER — DIAZEPAM 5 MG PO TABS
5.0000 mg | ORAL_TABLET | Freq: Four times a day (QID) | ORAL | Status: DC | PRN
  Administered 2014-07-10 – 2014-07-11 (×2): 5 mg via ORAL
  Filled 2014-07-10 (×2): qty 1

## 2014-07-10 MED ORDER — SODIUM CHLORIDE 0.9 % IR SOLN
Status: DC | PRN
  Administered 2014-07-10: 14:00:00

## 2014-07-10 MED ORDER — PHENYLEPHRINE 40 MCG/ML (10ML) SYRINGE FOR IV PUSH (FOR BLOOD PRESSURE SUPPORT)
PREFILLED_SYRINGE | INTRAVENOUS | Status: AC
  Filled 2014-07-10: qty 10

## 2014-07-10 SURGICAL SUPPLY — 77 items
APL SKNCLS STERI-STRIP NONHPOA (GAUZE/BANDAGES/DRESSINGS) ×1
BAG DECANTER FOR FLEXI CONT (MISCELLANEOUS) ×2 IMPLANT
BENZOIN TINCTURE PRP APPL 2/3 (GAUZE/BANDAGES/DRESSINGS) ×2 IMPLANT
BIT DRILL 2.4X (BIT) IMPLANT
BIT DRILL NEURO 2X3.1 SFT TUCH (MISCELLANEOUS) ×1 IMPLANT
BIT DRL 2.4X (BIT) ×1
BLADE CLIPPER SURG (BLADE) ×1 IMPLANT
BLADE ULTRA TIP 2M (BLADE) ×1 IMPLANT
BRUSH SCRUB EZ PLAIN DRY (MISCELLANEOUS) ×2 IMPLANT
CANISTER SUCT 3000ML PPV (MISCELLANEOUS) ×2 IMPLANT
CONT SPEC 4OZ CLIKSEAL STRL BL (MISCELLANEOUS) ×2 IMPLANT
COVER BACK TABLE 60X90IN (DRAPES) ×1 IMPLANT
DRAPE C-ARM 42X72 X-RAY (DRAPES) ×5 IMPLANT
DRAPE LAPAROTOMY 100X72 PEDS (DRAPES) ×2 IMPLANT
DRAPE MICROSCOPE LEICA (MISCELLANEOUS) IMPLANT
DRAPE POUCH INSTRU U-SHP 10X18 (DRAPES) ×2 IMPLANT
DRAPE SURG 17X23 STRL (DRAPES) ×4 IMPLANT
DRILL BIT (BIT) ×2
DRILL NEURO 2X3.1 SOFT TOUCH (MISCELLANEOUS) ×2
ELECT BLADE 4.0 EZ CLEAN MEGAD (MISCELLANEOUS) ×2
ELECT REM PT RETURN 9FT ADLT (ELECTROSURGICAL) ×2
ELECTRODE BLDE 4.0 EZ CLN MEGD (MISCELLANEOUS) ×1 IMPLANT
ELECTRODE REM PT RTRN 9FT ADLT (ELECTROSURGICAL) ×1 IMPLANT
GAUZE SPONGE 4X4 12PLY STRL (GAUZE/BANDAGES/DRESSINGS) ×2 IMPLANT
GAUZE SPONGE 4X4 16PLY XRAY LF (GAUZE/BANDAGES/DRESSINGS) IMPLANT
GLOVE BIO SURGEON STRL SZ8 (GLOVE) ×3 IMPLANT
GLOVE BIO SURGEON STRL SZ8.5 (GLOVE) ×2 IMPLANT
GLOVE BIOGEL PI IND STRL 7.0 (GLOVE) IMPLANT
GLOVE BIOGEL PI IND STRL 8 (GLOVE) IMPLANT
GLOVE BIOGEL PI IND STRL 8.5 (GLOVE) IMPLANT
GLOVE BIOGEL PI INDICATOR 7.0 (GLOVE) ×1
GLOVE BIOGEL PI INDICATOR 8 (GLOVE) ×3
GLOVE BIOGEL PI INDICATOR 8.5 (GLOVE) ×1
GLOVE ECLIPSE 7.5 STRL STRAW (GLOVE) ×4 IMPLANT
GLOVE EXAM NITRILE LRG STRL (GLOVE) IMPLANT
GLOVE EXAM NITRILE MD LF STRL (GLOVE) IMPLANT
GLOVE EXAM NITRILE XL STR (GLOVE) IMPLANT
GLOVE EXAM NITRILE XS STR PU (GLOVE) IMPLANT
GOWN STRL REUS W/ TWL LRG LVL3 (GOWN DISPOSABLE) IMPLANT
GOWN STRL REUS W/ TWL XL LVL3 (GOWN DISPOSABLE) ×1 IMPLANT
GOWN STRL REUS W/TWL 2XL LVL3 (GOWN DISPOSABLE) ×2 IMPLANT
GOWN STRL REUS W/TWL LRG LVL3 (GOWN DISPOSABLE)
GOWN STRL REUS W/TWL XL LVL3 (GOWN DISPOSABLE) ×4
KIT BASIN OR (CUSTOM PROCEDURE TRAY) ×2 IMPLANT
KIT INFUSE XX SMALL 0.7CC (Orthopedic Implant) ×1 IMPLANT
KIT ROOM TURNOVER OR (KITS) ×2 IMPLANT
NDL HYPO 21X1.5 SAFETY (NEEDLE) IMPLANT
NDL SPNL 18GX3.5 QUINCKE PK (NEEDLE) ×1 IMPLANT
NEEDLE HYPO 21X1.5 SAFETY (NEEDLE) ×2 IMPLANT
NEEDLE HYPO 22GX1.5 SAFETY (NEEDLE) ×2 IMPLANT
NEEDLE SPNL 18GX3.5 QUINCKE PK (NEEDLE) ×2 IMPLANT
NS IRRIG 1000ML POUR BTL (IV SOLUTION) ×2 IMPLANT
PACK LAMINECTOMY NEURO (CUSTOM PROCEDURE TRAY) ×2 IMPLANT
PAD ARMBOARD 7.5X6 YLW CONV (MISCELLANEOUS) ×6 IMPLANT
PIN MAYFIELD SKULL DISP (PIN) ×2 IMPLANT
ROD VERTEX 50MM (Rod) ×2 IMPLANT
RUBBERBAND STERILE (MISCELLANEOUS) IMPLANT
SCREW CANCELLOUS 3.5X14MM (Screw) ×6 IMPLANT
SCREW SET M6 (Screw) ×6 IMPLANT
SCREW VERTEX 4.0X14MM (Screw) ×2 IMPLANT
SPONGE LAP 4X18 X RAY DECT (DISPOSABLE) IMPLANT
SPONGE SURGIFOAM ABS GEL SZ50 (HEMOSTASIS) ×3 IMPLANT
STAPLER SKIN PROX WIDE 3.9 (STAPLE) IMPLANT
STRIP BIOACTIVE 10CC 25X100X4 (Miscellaneous) ×1 IMPLANT
STRIP CLOSURE SKIN 1/2X4 (GAUZE/BANDAGES/DRESSINGS) ×2 IMPLANT
SUT ETHILON 2 0 FS 18 (SUTURE) IMPLANT
SUT VIC AB 0 CT1 18XCR BRD8 (SUTURE) ×1 IMPLANT
SUT VIC AB 0 CT1 8-18 (SUTURE) ×4
SUT VIC AB 2-0 CP2 18 (SUTURE) ×3 IMPLANT
SYR 20CC LL (SYRINGE) ×1 IMPLANT
SYR 20ML ECCENTRIC (SYRINGE) ×1 IMPLANT
TAPE CLOTH SURG 4X10 WHT LF (GAUZE/BANDAGES/DRESSINGS) ×1 IMPLANT
TOWEL OR 17X24 6PK STRL BLUE (TOWEL DISPOSABLE) ×2 IMPLANT
TOWEL OR 17X26 10 PK STRL BLUE (TOWEL DISPOSABLE) ×2 IMPLANT
TRAY FOLEY W/METER SILVER 14FR (SET/KITS/TRAYS/PACK) ×1 IMPLANT
UNDERPAD 30X30 INCONTINENT (UNDERPADS AND DIAPERS) IMPLANT
WATER STERILE IRR 1000ML POUR (IV SOLUTION) ×2 IMPLANT

## 2014-07-10 NOTE — Anesthesia Postprocedure Evaluation (Signed)
  Anesthesia Post-op Note  Patient: Mary Watson  Procedure(s) Performed: Procedure(s) with comments: Cervical five-six, Cervical six-seven posterior fusion with instrumentation (N/A) - Cervical five-six, Cervical six-seven posterior fusion with instrumentation  Patient Location: PACU  Anesthesia Type:General  Level of Consciousness: awake, alert , oriented and patient cooperative  Airway and Oxygen Therapy: Patient Spontanous Breathing  Post-op Pain: mild, moderate  Post-op Assessment: Post-op Vital signs reviewed, Patient's Cardiovascular Status Stable, Respiratory Function Stable, Patent Airway, No signs of Nausea or vomiting and Pain level controlled              Post-op Vital Signs: stable  Last Vitals:  Filed Vitals:   07/10/14 1514  BP:   Pulse:   Temp: 36.8 C  Resp:     Complications: No apparent anesthesia complications

## 2014-07-10 NOTE — Plan of Care (Signed)
Problem: Consults Goal: Diagnosis - Spinal Surgery Outcome: Completed/Met Date Met:  07/10/14 Cervical Spine Fusion     

## 2014-07-10 NOTE — Anesthesia Procedure Notes (Signed)
Procedure Name: Intubation Date/Time: 07/10/2014 1:15 PM Performed by: Merdis Delay Pre-anesthesia Checklist: Patient identified, Timeout performed, Emergency Drugs available, Suction available and Patient being monitored Patient Re-evaluated:Patient Re-evaluated prior to inductionOxygen Delivery Method: Circle system utilized Preoxygenation: Pre-oxygenation with 100% oxygen Ventilation: Mask ventilation without difficulty Laryngoscope Size: Mac and 3 Grade View: Grade III Tube type: Oral Tube size: 7.5 mm Number of attempts: 1 Airway Equipment and Method: Stylet and LTA kit utilized Placement Confirmation: ETT inserted through vocal cords under direct vision,  breath sounds checked- equal and bilateral,  positive ETCO2 and CO2 detector Secured at: 22 cm Tube secured with: Tape Dental Injury: Teeth and Oropharynx as per pre-operative assessment  Comments: Previous anterior cervical surgery. Limited mouth opening, recessed chin.

## 2014-07-10 NOTE — H&P (Signed)
Subjective: The patient is a 49 year old white female on whom I performed a C5-6 and C6-7 anterior cervical discectomy, fusion, and plating in November 2014. The patient has had persistent neck pain. She was worked up with cervical x-rays and a cervical CT which demonstrated findings consistent with a cervical pseudoarthrosis. I discussed situation with the patient. We discussed the various options. She has decided to proceed with surgery.   Past Medical History  Diagnosis Date  . Neck pain   . Arthritis     Past Surgical History  Procedure Laterality Date  . Tubal ligation    . Breast surgery  1996    augmentation  . Cervical discectomy  11/2012    C5-6,C6-7 discectomy fusion and plating    Allergies  Allergen Reactions  . Ciprofloxacin     vomiting    History  Substance Use Topics  . Smoking status: Current Some Day Smoker -- 0.50 packs/day for 15 years    Types: Cigarettes  . Smokeless tobacco: Never Used  . Alcohol Use: No     Comment: quit alcohol few yrs ago    Family History  Problem Relation Age of Onset  . Arthritis Mother   . Cancer Mother   . Hyperlipidemia Father   . Hypertension Father   . Diabetes Father    Prior to Admission medications   Medication Sig Start Date End Date Taking? Authorizing Provider  Gabapentin Enacarbil (HORIZANT) 600 MG TBCR Take 600 mg by mouth at bedtime.    Yes Historical Provider, MD  methadone (DOLOPHINE) 10 MG/ML solution Take by mouth every 8 (eight) hours. Patient unsure of dose, 6 tx a week   Yes Historical Provider, MD  Multiple Vitamin (MULTIVITAMIN) tablet Take 1 tablet by mouth daily.   Yes Historical Provider, MD     Review of Systems  Positive ROS: As above  All other systems have been reviewed and were otherwise negative with the exception of those mentioned in the HPI and as above.  Objective: Vital signs in last 24 hours: Temp:  [97.7 F (36.5 C)] 97.7 F (36.5 C) (06/22 0948) Pulse Rate:  [84] 84 (06/22  0948) Resp:  [20] 20 (06/22 0948) BP: (133)/(74) 133/74 mmHg (06/22 0948) SpO2:  [95 %] 95 % (06/22 0948) Weight:  [76.295 kg (168 lb 3.2 oz)] 76.295 kg (168 lb 3.2 oz) (06/22 0948)  General Appearance: Alert, cooperative, no distress, Head: Normocephalic, without obvious abnormality, atraumatic Eyes: PERRL, conjunctiva/corneas clear, EOM's intact,    Ears: Normal  Throat: Normal  Neck: Supple, symmetrical, trachea midline, no adenopathy; thyroid: No enlargement/tenderness/nodules; no carotid bruit or JVD. The patient's left cervical incision is well-healed. Back: Symmetric, no curvature, ROM normal, no CVA tenderness Lungs: Clear to auscultation bilaterally, respirations unlabored Heart: Regular rate and rhythm, no murmur, rub or gallop Abdomen: Soft, non-tender,, no masses, no organomegaly Extremities: Extremities normal, atraumatic, no cyanosis or edema Pulses: 2+ and symmetric all extremities Skin: Skin color, texture, turgor normal, no rashes or lesions  NEUROLOGIC:   Mental status: alert and oriented, no aphasia, good attention span, Fund of knowledge/ memory ok Motor Exam - grossly normal Sensory Exam - grossly normal Reflexes:  Coordination - grossly normal Gait - grossly normal Balance - grossly normal Cranial Nerves: I: smell Not tested  II: visual acuity  OS: Normal  OD: Normal   II: visual fields Full to confrontation  II: pupils Equal, round, reactive to light  III,VII: ptosis None  III,IV,VI: extraocular muscles  Full ROM  V: mastication Normal  V: facial light touch sensation  Normal  V,VII: corneal reflex  Present  VII: facial muscle function - upper  Normal  VII: facial muscle function - lower Normal  VIII: hearing Not tested  IX: soft palate elevation  Normal  IX,X: gag reflex Present  XI: trapezius strength  5/5  XI: sternocleidomastoid strength 5/5  XI: neck flexion strength  5/5  XII: tongue strength  Normal    Data Review Lab Results   Component Value Date   WBC 9.7 07/05/2014   HGB 13.7 07/05/2014   HCT 39.3 07/05/2014   MCV 87.7 07/05/2014   PLT 254 07/05/2014   No results found for: NA, K, CL, CO2, BUN, CREATININE, GLUCOSE No results found for: INR, PROTIME  Assessment/Plan: C5-6 and C6-7 pseudoarthrosis, cervicalgia: I discussed the situation with the patient. I have reviewed her imaging studies with her and pointed out the abnormalities. We have discussed the various treatment options including surgery. I have described the surgical treatment option of a C5-6 and C6-7 posterior instrumentation and fusion. I have shown her surgical models. We have discussed the risks, benefits, alternatives, and likelihood of achieving goals with surgery. I have answered all patient's questions. She has decided to proceed with surgery.   Cristi Loron 07/10/2014 12:55 PM

## 2014-07-10 NOTE — OR Nursing (Signed)
Foley catheter removed end of procedure

## 2014-07-10 NOTE — Op Note (Signed)
Brief history: the patient is a 49 year old white female on whom I performed a C5-6 and C6-7 anterior cervical discectomy, fusion, and plating about 18 months ago. She's had persistent neck pain. She was worked up with cervical x-rays and a cervical CT scan which demonstrated findings consistent with a cervical pseudoarthrosis. I discussed the situation with the patient. We discussed the various treatment options including a posterior cervical instrumentation and fusion. The patient has weighed the risks, benefits, and alternative surgery and decided proceed with that operation.  Preoperative diagnosis: Cervical pseudoarthrosis, cervicalgia  Postop diagnosis: Same  Procedure: Posterior lateral arthrodesis C5-6 and C6-7 with local morcellized autograft bone, bone morphogenic protein-soaked collagen sponges, and Bone graft extender; posterior cervical instrumentation C5 C7 with [employer] titanium lateral mass screws and rods  Surgeon: Dr. Delma Officer  Assistant: Dr. Daryl Eastern  Anesthesia: Gen. endotracheal  Estimated blood loss: Minimal  Specimens: None  Drains: None  Complications: None  Description of procedure: The patient was brought to the operating room by the anesthesia team. General endotracheal anesthesia was induced. I applied the [place] 3. headrest to the patient's calvarium. The patient was turned to the prone position on the chest rolls. The patient's occipital scalp was shaved with clippers in this region as well as her posterior cervical and upper thoracic region was prepared with Betadine scrub and Betadine solution. Sterile drapes were applied. I then injected the area to be incised with Marcaine with epinephrine solution. I used a scalpel to make a linear midline incision over the cervical thoracic junction. I used electrocautery to perform a bilateral subperiosteal dissection exposing the spinous process lamina of C5, C6 and C7. We confirmed our location with intraoperative  fluoroscopy. I exposed the lateral masses at C5, C6 and C7 bilaterally with electrocautery. We used the cerebellar retractors for exposure. We identified the center of the lateral masses at C5, C6 and C7 bilaterally. We drilled a small pilot hole and then drilled a 14 mm hole hole in the lateral masses at C5, C6 and C7 imaging in a cephalad and lateral direction. We were able to do it under fluoroscopic guidance at at C5 but we could not use fluoroscopy at C6-7 because of the patient's shoulders. We probed inside the polyp holes to rule out cortical breaches. We then inserted 14 mm lateral mass screws into the lateral masses of C5, C6 and C7 bilaterally. We then bent a rod and connected the unilateral screws with a lordotic rod. We tighten the capsule appropriately. This completed the instrumentation.  We now turned our attention to the arthrodesis. I used a high-speed drill to decorticate the lamina and part of the lateral masses at C5, C6 and C7 bilaterally. We saved the bone drilling for local bone. We then laid bone morphogenic protein-soaked collagen sponges and Over the decorticated posterior lateral structures from C5 C7. This completed the arthrodesis.  We then obtained hemostasis using bipolar cautery. We removed the retractors. We then reapproximated patient's cervical thoracic fascia with interrupted 0 Vicodin suture. We reapproximated the subcutaneous tissue with interrupted 20 Vicryls suture. We reapproximated the patient's skin with Steri-Strips and benzoin. The wound was then coated with bacitracin ointment. A sterile dressing was applied. The drapes were removed. The patient was then returned to the supine position. I removed the Mayfield 3 point headrest from the patient's calvarium. By report, all sponge, instrument, and needle counts were correct at the end this case.

## 2014-07-10 NOTE — Anesthesia Preprocedure Evaluation (Signed)
Anesthesia Evaluation  Patient identified by MRN, date of birth, ID band Patient awake    Reviewed: Allergy & Precautions, NPO status , Patient's Chart, lab work & pertinent test results  Airway Mallampati: II       Dental   Pulmonary Current Smoker,    Pulmonary exam normal       Cardiovascular Normal cardiovascular exam    Neuro/Psych  Neuromuscular disease    GI/Hepatic   Endo/Other    Renal/GU      Musculoskeletal  (+) Arthritis -,   Abdominal   Peds  Hematology   Anesthesia Other Findings Chronic neck pain   Reproductive/Obstetrics                             Anesthesia Physical Anesthesia Plan  ASA: II  Anesthesia Plan: General   Post-op Pain Management:    Induction: Intravenous  Airway Management Planned: Oral ETT  Additional Equipment:   Intra-op Plan:   Post-operative Plan: Extubation in OR  Informed Consent: I have reviewed the patients History and Physical, chart, labs and discussed the procedure including the risks, benefits and alternatives for the proposed anesthesia with the patient or authorized representative who has indicated his/her understanding and acceptance.     Plan Discussed with: CRNA, Anesthesiologist and Surgeon  Anesthesia Plan Comments:         Anesthesia Quick Evaluation

## 2014-07-10 NOTE — Transfer of Care (Signed)
Immediate Anesthesia Transfer of Care Note  Patient: Mary Watson  Procedure(s) Performed: Procedure(s) with comments: Cervical five-six, Cervical six-seven posterior fusion with instrumentation (N/A) - Cervical five-six, Cervical six-seven posterior fusion with instrumentation  Patient Location: PACU  Anesthesia Type:General  Level of Consciousness: awake, alert  and oriented  Airway & Oxygen Therapy: Patient Spontanous Breathing  Post-op Assessment: Report given to RN and Post -op Vital signs reviewed and stable  Post vital signs: Reviewed and stable  Last Vitals:  Filed Vitals:   07/10/14 0948  BP: 133/74  Pulse: 84  Temp: 36.5 C  Resp: 20    Complications: No apparent anesthesia complications

## 2014-07-11 ENCOUNTER — Encounter (HOSPITAL_COMMUNITY): Payer: Self-pay | Admitting: Neurosurgery

## 2014-07-11 MED ORDER — DOCUSATE SODIUM 100 MG PO CAPS: 100.0000 mg | ORAL_CAPSULE | Freq: Two times a day (BID) | ORAL | Status: DC

## 2014-07-11 MED ORDER — OXYCODONE-ACETAMINOPHEN 10-325 MG PO TABS: 1.0000 | ORAL_TABLET | ORAL | Status: DC | PRN

## 2014-07-11 MED ORDER — METHADONE HCL 10 MG PO TABS: 90.0000 mg | ORAL_TABLET | Freq: Every day | ORAL | Status: DC

## 2014-07-11 MED ORDER — METHADONE HCL 10 MG/ML PO CONC: 90.0000 mg | Freq: Every day | ORAL | Status: DC

## 2014-07-11 MED ORDER — CYCLOBENZAPRINE HCL 10 MG PO TABS: 10.0000 mg | ORAL_TABLET | Freq: Three times a day (TID) | ORAL | Status: DC | PRN

## 2014-07-11 NOTE — Discharge Summary (Signed)
Physician Discharge Summary  Patient ID: Mary Watson MRN: [id number] DOB/AGE: 06/12/1965 49 y.o.  Admit date: 07/10/2014 Discharge date: 07/11/2014  Admission Diagnoses: C5-6 and C6-7 pseudoarthrosis, cervicalgia  Discharge Diagnoses: The same Active Problems:   Cervical pseudoarthrosis   Discharged Condition: good  Hospital Course: I performed a C5-6 and C6-7 posterior cervical instrumentation and fusion on the patient on 07/10/2014. The surgery went well.  The patient's postoperative course was unremarkable. On postoperative day #1 she requested discharge to home. She was given oral and written discharge instructions. All her questions were answered.  Consults: None Significant Diagnostic Studies: None Treatments: C5-6 and C6-7 posterior is rotation effusion. Discharge Exam: Blood pressure 106/67, pulse 79, temperature 98 F (36.7 C), temperature source Oral, resp. rate 18, height  (1.651 m), weight 76.295 kg (168 lb 3.2 oz), last menstrual period 07/10/2010, SpO2 96 %. The patient is alert and pleasant. Her neck is appropriately sore. She looks well. Her dressing is clean and dry. Her strength is normal on her upper extremities.  Disposition: Home  Discharge Instructions    Call MD for:  difficulty breathing, headache or visual disturbances    Complete by:  As directed      Call MD for:  extreme fatigue    Complete by:  As directed      Call MD for:  hives    Complete by:  As directed      Call MD for:  persistant dizziness or light-headedness    Complete by:  As directed      Call MD for:  persistant nausea and vomiting    Complete by:  As directed      Call MD for:  redness, tenderness, or signs of infection (pain, swelling, redness, odor or green/yellow discharge around incision site)    Complete by:  As directed      Call MD for:  severe uncontrolled pain    Complete by:  As directed      Call MD for:  temperature >100.4    Complete by:  As directed      Diet  - low sodium heart healthy    Complete by:  As directed      Discharge instructions    Complete by:  As directed   Call [phone] for a followup appointment. Take a stool softener while you are using pain medications.     Driving Restrictions    Complete by:  As directed   Do not drive for 2 weeks.     Increase activity slowly    Complete by:  As directed      Lifting restrictions    Complete by:  As directed   Do not lift more than 5 pounds. No excessive bending or twisting.     May shower / Bathe    Complete by:  As directed   He may shower after the pain she is removed 3 days after surgery. Leave the incision alone.     Remove dressing in 48 hours    Complete by:  As directed   Your stitches are under the scan and will dissolve by themselves. The Steri-Strips will fall off after you take a few showers. Do not rub back or pick at the wound, Leave the wound alone.            Medication List    TAKE these medications        cyclobenzaprine 10 MG tablet  Commonly known as:  FLEXERIL  Take 1 tablet (10 mg total) by mouth 3 (three) times daily as needed for muscle spasms.     docusate sodium 100 MG capsule  Commonly known as:  COLACE  Take 1 capsule (100 mg total) by mouth 2 (two) times daily.     HORIZANT 600 MG Tbcr  Generic drug:  Gabapentin Enacarbil  Take 600 mg by mouth at bedtime.     methadone 10 MG/ML solution  Commonly known as:  DOLOPHINE  Take 90 mg by mouth daily.     multivitamin tablet  Take 1 tablet by mouth daily.     oxyCODONE-acetaminophen 10-325 MG per tablet  Commonly known as:  PERCOCET  Take 1 tablet by mouth every 4 (four) hours as needed for pain.         SignedCristi Loron 07/11/2014, 10:24 AM

## 2014-07-11 NOTE — Progress Notes (Signed)
Pt given D/C instructions with Rx's, verbal understanding was provided. Pt's incision is covered with gauze dressing and has no sign of infection. Pt's IV was removed prior to D/C. Pt D/C'd home via wheelchair @ 1205 per MD order. Pt is stable @ D/C and has no other needs at this time. Rema Fendt, RN

## 2014-08-21 ENCOUNTER — Other Ambulatory Visit: Payer: Self-pay

## 2014-08-21 DIAGNOSIS — Z1231 Encounter for screening mammogram for malignant neoplasm of breast: Secondary | ICD-10-CM

## 2014-08-27 ENCOUNTER — Ambulatory Visit
Admission: RE | Admit: 2014-08-27 | Discharge: 2014-08-27 | Disposition: A | Discharge status: Home / Self Care | Payer: Managed Care, Other (non HMO) | Source: Ambulatory Visit

## 2014-08-27 DIAGNOSIS — Z1231 Encounter for screening mammogram for malignant neoplasm of breast: Secondary | ICD-10-CM

## 2015-01-23 ENCOUNTER — Ambulatory Visit: Payer: Managed Care, Other (non HMO) | Admitting: Physician Assistant

## 2015-01-27 ENCOUNTER — Ambulatory Visit: Payer: Managed Care, Other (non HMO) | Admitting: Physician Assistant

## 2015-01-29 ENCOUNTER — Encounter: Payer: Self-pay | Admitting: Physician Assistant

## 2015-01-29 ENCOUNTER — Ambulatory Visit (INDEPENDENT_AMBULATORY_CARE_PROVIDER_SITE_OTHER): Payer: BLUE CROSS/BLUE SHIELD | Admitting: Physician Assistant

## 2015-01-29 VITALS — BP 122/70 | HR 84 | Temp 98.1°F | Resp 18 | Wt 175.0 lb

## 2015-01-29 DIAGNOSIS — M542 Cervicalgia: Secondary | ICD-10-CM | POA: Diagnosis not present

## 2015-01-29 DIAGNOSIS — G894 Chronic pain syndrome: Secondary | ICD-10-CM

## 2015-01-29 NOTE — Progress Notes (Addendum)
Patient ID: Mary Watson MRN: [id number], DOB: April 17, 1965, 50 y.o. Date of Encounter: 01/29/2015, 3:20 PM    Chief Complaint:  Chief Complaint  Patient presents with  . wants to discuss meds    multiple pain meds used in past,  found that methadone works better then anything else, needs to find someone who can prescibe this and not put her in addiction clinic.     HPI: 50 y.o. year old white female wearing cervical collar.  Says she had 2 surgeries to her neck by Dr. Lovell SheehanJenkins.  Says first one, was anterior approach--was oow couple months.   Says when she went for f/u last year--he did XRays etc he said the prior surgery "didnt work, it didn't fuse".  Had a 2nd surgery--"went in from the back"  "He said that surgiclaly he has done all he can do for the spinal stenosis. Said her chronic pain secondary to arthritis in neck."   Says in 2015 she went to [facility]---tried Hydrocodone, Oxycodone, Diladid, Opana..--" "Tried everything in the book--nothing helped."   Says Methadone helps, gives her relief.  However, cannot find a doctor to prescribe it." Says that currently she is having to be seen every day, pay at every visit, in order to get it. Says she just needs to find a doctor that cna prescribe Methadone.   Says she has also read that a lot of people who get relief with Methadone, Suboxone also works for them---says she could try that medication--but knows none of the other meds work for her".  Also [facility] and was there for many hours waiting to see provider---doesn't want to go back there, unless it is her only option.      Home Meds:   Outpatient Prescriptions Prior to Visit  Medication Sig Dispense Refill  . cyclobenzaprine (FLEXERIL) 10 MG tablet Take 1 tablet (10 mg total) by mouth 3 (three) times daily as needed for muscle spasms. 50 tablet 1  . Gabapentin Enacarbil (HORIZANT) 600 MG TBCR Take 600 mg by mouth at bedtime.     . methadone  (DOLOPHINE) 10 MG/ML solution Take 90 mg by mouth daily.     . Multiple Vitamin (MULTIVITAMIN) tablet Take 1 tablet by mouth daily.    Marland Kitchen. docusate sodium (COLACE) 100 MG capsule Take 1 capsule (100 mg total) by mouth 2 (two) times daily. (Patient not taking: Reported on 01/29/2015) 60 capsule 0  . oxyCODONE-acetaminophen (PERCOCET) 10-325 MG per tablet Take 1 tablet by mouth every 4 (four) hours as needed for pain. (Patient not taking: Reported on 01/29/2015) 100 tablet 0   No facility-administered medications prior to visit.    Allergies:  Allergies  Allergen Reactions  . Ciprofloxacin     vomiting      Review of Systems: See HPI for pertinent ROS. All other ROS negative.    Physical Exam: Blood pressure 122/70, pulse 84, temperature 98.1 F (36.7 C), temperature source Oral, resp. rate 18, weight 175 lb (79.379 kg)., Body mass index is 29.12 kg/(m^2). General:  WNWD WF. Wearing cervical collar/brace. Appears in no acute distress. Neck: Supple. No thyromegaly. No lymphadenopathy. Lungs: Clear bilaterally to auscultation without wheezes, rales, or rhonchi. Breathing is unlabored. Heart: Regular rhythm. No murmurs, rubs, or gallops. Msk:  Strength and tone normal for age. Extremities/Skin: Warm and dry. Psych:  Responds to questions appropriately with a normal affect.     ASSESSMENT AND PLAN:  50 y.o. year old female with  1. Chronic pain syndrome I ordered Referral ---added comment that includes pertinent info that is documented in HPI above.  - Ambulatory referral to Pain Clinic  2. Cervicalgia  ADDENDUM--ADDED 03/20/2015:  RECEIVED RECORD FROM  "[facility]" Their OV Note Dated 03/12/2015: At that visit, they prescribed; --Opana ER 15mg --take 1 Q 12 Hr # 28+0 -- Opana 5mg  1 po TID prn  # 42+0 --Narcan  To F/U OV there 1 month  This note sent to scan into Epic.     Signed, [address] [place], [place], [facility] 01/29/2015 3:20 PM

## 2015-03-10 ENCOUNTER — Telehealth: Payer: Self-pay | Admitting: Physician Assistant

## 2015-03-10 NOTE — Telephone Encounter (Signed)
Patient calling to say that her pain management referral is to the wrong place please call her back at  [phone]

## 2015-03-12 NOTE — Telephone Encounter (Signed)
Pt never returned call until this afternoon stating no one called her back and that she is at her appt with [facility] and they are needing the referral. Referral faxed to [phone]

## 2015-03-12 NOTE — Telephone Encounter (Signed)
lmtrc

## 2015-04-02 ENCOUNTER — Ambulatory Visit: Payer: BLUE CROSS/BLUE SHIELD | Admitting: Physician Assistant

## 2015-04-17 ENCOUNTER — Ambulatory Visit: Payer: BLUE CROSS/BLUE SHIELD | Admitting: Physician Assistant

## 2016-04-21 ENCOUNTER — Encounter: Payer: Self-pay | Admitting: Physician Assistant

## 2016-04-21 ENCOUNTER — Ambulatory Visit (INDEPENDENT_AMBULATORY_CARE_PROVIDER_SITE_OTHER): Payer: PRIVATE HEALTH INSURANCE | Admitting: Physician Assistant

## 2016-04-21 VITALS — BP 118/84 | HR 83 | Temp 97.7°F | Resp 16 | Wt 152.0 lb

## 2016-04-21 DIAGNOSIS — G4700 Insomnia, unspecified: Secondary | ICD-10-CM | POA: Diagnosis not present

## 2016-04-21 DIAGNOSIS — F419 Anxiety disorder, unspecified: Secondary | ICD-10-CM | POA: Diagnosis not present

## 2016-04-21 MED ORDER — ALPRAZOLAM 0.5 MG PO TABS: 0.5000 mg | ORAL_TABLET | Freq: Two times a day (BID) | ORAL | 0 refills | Status: DC | PRN

## 2016-04-21 NOTE — Progress Notes (Signed)
Patient ID: Mary Watson MRN: [id number], DOB: 11/30/1965, 51 y.o. Date of Encounter: @  Chief Complaint:  Chief Complaint  Patient presents with  . Anxiety    going on for months  . trouble sleeping    HPI: 51 y.o. year old female  presents with above.   Says that she "has not slept through the night for as long as she can remember ". Says that she wakes 3 or 4 times per night---every night. "Then has to go to work feeling like she is not rested."  There has been increased stress at work. Also in February suddenly had to move and relocate. She had to pack etc. all by herself. Also been an adjustment getting used to living in a new place and also the increased cost with it.  She has been having increased anxiety. Also she is "running on empty "says that she has been "pushing through "and feels like she has been in "survival mode".  Says that the PA at Dr. Plummer's office (says that she goes there once a month for her Suboxone--- says that she has had 2 different neck surgeries and then pain medicine after that) said that she should see her primary care about some medicines to help with this.  Asked if she has been on medications for anxiety in the past. Says that she is "scared of medicines like" Zoloft  "and has reasons to be afraid of them." Says that "in the past she was on that type of medicine and made life decisions that she doesn't believe she would have made if she had not been on those medicines".  Says that she is also worried about taking Ambien. Used this in the past and it caused her to sleep walk and do things in her sleep but she had no memory of.  In her sleep, she had turned on the light and had done things with her jewelry and had no memory of it. The next morning when she saw that---she thought someone had broken into her house.  Says in the past she used Xanax and that worked well. Would take it at night and it would help her to sleep good but not feel groggy  the next day. Did use one during the day and she was able to work and go to meetings etc. without feeling groggy.  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  Past Medical History:  Diagnosis Date  . Arthritis   . Neck pain      Home Meds: Outpatient Medications Prior to Visit  Medication Sig Dispense Refill  . Gabapentin Enacarbil (HORIZANT) 600 MG TBCR Take 600 mg by mouth at bedtime.     . Multiple Vitamin (MULTIVITAMIN) tablet Take 1 tablet by mouth daily.    . cyclobenzaprine (FLEXERIL) 10 MG tablet Take 1 tablet (10 mg total) by mouth 3 (three) times daily as needed for muscle spasms. 50 tablet 1  . docusate sodium (COLACE) 100 MG capsule Take 1 capsule (100 mg total) by mouth 2 (two) times daily. (Patient not taking: Reported on 01/29/2015) 60 capsule 0  . methadone (DOLOPHINE) 10 MG/ML solution Take 90 mg by mouth daily.      No facility-administered medications prior to visit.     Allergies:  Allergies  Allergen Reactions  .  Ciprofloxacin     vomiting    Social History   Social History  . Marital status: Divorced    Spouse name: N/A  . Number of children: N/A  . Years of education: N/A   Occupational History  . Not on file.   Social History Main Topics  . Smoking status: Current Some Day Smoker    Packs/day: 0.50    Years: 15.00    Types: Cigarettes  . Smokeless tobacco: Never Used  . Alcohol use No     Comment: quit alcohol few yrs ago  . Drug use: No  . Sexual activity: Not on file   Other Topics Concern  . Not on file   Social History Narrative  . No narrative on file    Family History  Problem Relation Age of Onset  . Arthritis Mother   . Cancer Mother   . Hyperlipidemia Father   . Hypertension Father   . Diabetes Father      Review of Systems:  See HPI for pertinent ROS. All other  ROS negative.    Physical Exam: Blood pressure 118/84, pulse 83, temperature 97.7 F (36.5 C), temperature source Oral, resp. rate 16, weight 152 lb (68.9 kg), SpO2 98 %., Body mass index is 25.29 kg/m. General: WNWD WF. Appears in no acute distress. Neck: Supple. No thyromegaly. No lymphadenopathy. Lungs: Clear bilaterally to auscultation without wheezes, rales, or rhonchi. Breathing is unlabored. Heart: RRR with S1 S2. No murmurs, rubs, or gallops. Musculoskeletal:  Strength and tone normal for age. Extremities/Skin: Warm and dry.  Neuro: Alert and oriented X 3. Moves all extremities spontaneously. Gait is normal. CNII-XII grossly in tact. Psych:  Responds to questions appropriately with a normal affect.     ASSESSMENT AND PLAN:  51 y.o. year old female with  1. Anxiety disorder, unspecified type Recommend that she take a Xanax every night -- she definitely needs to get sleep. Told her not to take it during the day unless absolutely necessary. Discussed that her body will get dependent and tolerant to this medicine so need to limit medicine as much as possible. F/U visit for one month to reassess and monitor at that point. Follow-up sooner if needed. - ALPRAZolam (XANAX) 0.5 MG tablet; Take 1 tablet (0.5 mg total) by mouth 2 (two) times daily as needed for anxiety.  Dispense: 60 tablet; Refill: 0  2. Insomnia, unspecified type  - ALPRAZolam (XANAX) 0.5 MG tablet; Take 1 tablet (0.5 mg total) by mouth 2 (two) times daily as needed for anxiety.  Dispense: 60 tablet; Refill: 0   Signed, [address] [place], [place], [facility] 04/21/2016 3:10 PM

## 2016-05-19 ENCOUNTER — Ambulatory Visit (INDEPENDENT_AMBULATORY_CARE_PROVIDER_SITE_OTHER): Payer: PRIVATE HEALTH INSURANCE | Admitting: Physician Assistant

## 2016-05-19 ENCOUNTER — Encounter: Payer: Self-pay | Admitting: Physician Assistant

## 2016-05-19 VITALS — BP 130/84 | HR 90 | Temp 97.9°F | Resp 16 | Wt 150.4 lb

## 2016-05-19 DIAGNOSIS — F411 Generalized anxiety disorder: Secondary | ICD-10-CM | POA: Diagnosis not present

## 2016-05-19 DIAGNOSIS — G4700 Insomnia, unspecified: Secondary | ICD-10-CM | POA: Diagnosis not present

## 2016-05-19 DIAGNOSIS — F419 Anxiety disorder, unspecified: Secondary | ICD-10-CM | POA: Diagnosis not present

## 2016-05-19 MED ORDER — ALPRAZOLAM 0.5 MG PO TABS: 0.5000 mg | ORAL_TABLET | Freq: Two times a day (BID) | ORAL | 2 refills | Status: DC | PRN

## 2016-05-19 NOTE — Progress Notes (Signed)
Patient ID: Mary Watson MRN: [id number], DOB: [birth year], 51 y.o. Date of Encounter: @  Chief Complaint:  Chief Complaint  Patient presents with  . left eye red  . Anxiety    f/u    HPI: 51 y.o. year old female  presents with above.    04/21/2016: Says that she "has not slept through the night for as long as she can remember ". Says that she wakes 3 or 4 times per night---every night. "Then has to go to work feeling like she is not rested."  There has been increased stress at work. Also in February suddenly had to move and relocate. She had to pack etc. all by herself. Also been an adjustment getting used to living in a new place and also the increased cost with it.  She has been having increased anxiety. Also she is "running on empty "says that she has been "pushing through "and feels like she has been in "survival mode".  Says that the PA at Dr. Plummer's office (says that she goes there once a month for her Suboxone--- says that she has had 2 different neck surgeries and then pain medicine after that) said that she should see her primary care about some medicines to help with this.  Asked if she has been on medications for anxiety in the past. Says that she is "scared of medicines like" Zoloft  "and has reasons to be afraid of them." Says that "in the past she was on that type of medicine and made life decisions that she doesn't believe she would have made if she had not been on those medicines".  Says that she is also worried about taking Ambien. Used this in the past and it caused her to sleep walk and do things in her sleep but she had no memory of.  In her sleep, she had turned on the light and had done things with her jewelry and had no memory of it. The next morning when she saw that---she thought someone had broken into her house.  Says in the past she used Xanax and that worked well. Would take it at night and it would help her to sleep good but not feel groggy the next  day. Did use one during the day and she was able to work and go to meetings etc. without feeling groggy.  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    1. Anxiety disorder, unspecified type Recommend that she take a Xanax every night -- she definitely needs to get sleep. Told her not to take it during the day unless absolutely necessary. Discussed that her body will get dependent and tolerant to this medicine so need to limit medicine as much as possible. F/U visit for one month to reassess and monitor at that point. Follow-up sooner if needed. - ALPRAZolam (XANAX) 0.5 MG tablet; Take 1 tablet (0.5 mg total) by mouth 2 (two) times daily as needed for anxiety.  Dispense: 60 tablet; Refill: 0  2. Insomnia, unspecified type - ALPRAZolam (XANAX) 0.5 MG tablet; Take 1 tablet (0.5 mg total) by mouth 2 (two) times daily as needed for anxiety.  Dispense: 60 tablet; Refill: 0   05/19/2016: Today she reports that she has been taking one Xanax in the morning on her way to work when she  starts to feel anxious and stressed and this helps to calm her down. Says then after she has her lunch and is starting to feel anxious and stressed again takes another Xanax and again this calms her down. Says that then at night she isn't needing to use any Xanax at all. Even so, she is sleeping much better. Feels like because she is staying more relaxed through the day, she is not having the problems with insomnia like she was before. Says now she is sleeping all the way through except she may wake up around 4 AM but then drifts back to sleep. Says that she is getting way more sleep now than she has in quite a while. Feels like this dosing of Xanax is controlling her anxiety and her insomnia. Also wanted to check on her eye. Says that Saturday morning when she woke up she  saw that the lateral aspect of her left eye looked like a blood vessel had burst there. Says that it looked a little worse when she woke up Sunday morning and again on Monday morning but then today it is looking better. States that she has had no pain and no irritation there at all and has had no change in vision. No other concerns to address today.   Past Medical History:  Diagnosis Date  . Arthritis   . Neck pain      Home Meds: Outpatient Medications Prior to Visit  Medication Sig Dispense Refill  . ALPRAZolam (XANAX) 0.5 MG tablet Take 1 tablet (0.5 mg total) by mouth 2 (two) times daily as needed for anxiety. 60 tablet 0  . Gabapentin Enacarbil (HORIZANT) 600 MG TBCR Take 600 mg by mouth at bedtime.     . Multiple Vitamin (MULTIVITAMIN) tablet Take 1 tablet by mouth daily.    . SUBOXONE 8-2 MG FILM Take 1 tablet by mouth 2 (two) times daily. Dissolve 1 tablet under tongue twice daily  0   No facility-administered medications prior to visit.     Allergies:  Allergies  Allergen Reactions  . Ciprofloxacin     vomiting    Social History   Social History  . Marital status: Divorced    Spouse name: N/A  . Number of children: N/A  . Years of education: N/A   Occupational History  . Not on file.   Social History Main Topics  . Smoking status: Current Some Day Smoker    Packs/day: 0.50    Years: 15.00    Types: Cigarettes  . Smokeless tobacco: Never Used  . Alcohol use No     Comment: quit alcohol few yrs ago  . Drug use: No  . Sexual activity: Not on file   Other Topics Concern  . Not on file   Social History Narrative  . No narrative on file    Family History  Problem Relation Age of Onset  . Arthritis Mother   . Cancer Mother   . Hyperlipidemia Father   . Hypertension Father   . Diabetes Father      Review of Systems:  See HPI for pertinent ROS. All other ROS negative.    Physical Exam: Blood pressure 130/84, pulse 90, temperature 97.9 F (36.6  C), temperature source Oral, resp. rate 16, weight 150 lb 6.4 oz (68.2 kg), SpO2 97 %., Body mass index is 25.03 kg/m. General: WNWD WF. Appears in no acute distress. HEENT: Left Eye: Lateral aspect--bright red blood color. Iris, Pupil appear normal. Remainder of eye appears normal.  Neck: Supple. No thyromegaly. No lymphadenopathy. Lungs: Clear bilaterally to auscultation without wheezes, rales, or rhonchi. Breathing is unlabored. Heart: RRR with S1 S2. No murmurs, rubs, or gallops. Musculoskeletal:  Strength and tone normal for age. Extremities/Skin: Warm and dry.  Neuro: Alert and oriented X 3. Moves all extremities spontaneously. Gait is normal. CNII-XII grossly in tact. Psych:  Responds to questions appropriately with a normal affect.     ASSESSMENT AND PLAN:  51 y.o. year old female with   1. Anxiety disorder, unspecified type 2. Insomnia, unspecified type - ALPRAZolam (XANAX) 0.5 MG tablet; Take 1 tablet (0.5 mg total) by mouth 2 (two) times daily as needed for anxiety.  Dispense: 60 tablet; Refill: 2  Discussed that Xanax will cause dependability and her body will become dependent on this. Discussed trying to decrease the amount of this that she is using and only use it when absolutely necessary. She voices understanding and agrees.  Discussed that her eye looks like it is just has a burst blood vessel and that this should gradually resolve on its own. If it worsens or does not resolve over the upcoming 1-2 weeks, then see an eye doctor.   Routine OV 6 months, sooner if needed.    Signed, [address] [place], [place], [facility] 05/19/2016 11:38 AM

## 2016-05-20 ENCOUNTER — Ambulatory Visit: Payer: PRIVATE HEALTH INSURANCE | Admitting: Physician Assistant

## 2016-07-13 ENCOUNTER — Other Ambulatory Visit: Payer: Self-pay | Admitting: Family Medicine

## 2016-07-13 DIAGNOSIS — Z1231 Encounter for screening mammogram for malignant neoplasm of breast: Secondary | ICD-10-CM

## 2016-07-20 ENCOUNTER — Ambulatory Visit: Payer: Managed Care, Other (non HMO)

## 2016-07-22 ENCOUNTER — Ambulatory Visit
Admission: RE | Admit: 2016-07-22 | Discharge: 2016-07-22 | Disposition: A | Discharge status: Home / Self Care | Payer: BLUE CROSS/BLUE SHIELD | Source: Ambulatory Visit | Attending: Family Medicine | Admitting: Family Medicine

## 2016-07-22 DIAGNOSIS — Z1231 Encounter for screening mammogram for malignant neoplasm of breast: Secondary | ICD-10-CM

## 2016-07-26 ENCOUNTER — Other Ambulatory Visit: Payer: Self-pay | Admitting: Family Medicine

## 2016-07-26 DIAGNOSIS — R928 Other abnormal and inconclusive findings on diagnostic imaging of breast: Secondary | ICD-10-CM

## 2016-08-04 ENCOUNTER — Ambulatory Visit
Admission: RE | Admit: 2016-08-04 | Discharge: 2016-08-04 | Disposition: A | Discharge status: Home / Self Care | Payer: BLUE CROSS/BLUE SHIELD | Source: Ambulatory Visit | Attending: Family Medicine | Admitting: Family Medicine

## 2016-08-04 DIAGNOSIS — R928 Other abnormal and inconclusive findings on diagnostic imaging of breast: Secondary | ICD-10-CM

## 2016-08-17 ENCOUNTER — Other Ambulatory Visit: Payer: Self-pay | Admitting: Physician Assistant

## 2016-08-17 DIAGNOSIS — G4700 Insomnia, unspecified: Secondary | ICD-10-CM

## 2016-08-17 DIAGNOSIS — F419 Anxiety disorder, unspecified: Secondary | ICD-10-CM

## 2016-08-17 NOTE — Telephone Encounter (Signed)
Ok to refill 

## 2016-08-18 NOTE — Telephone Encounter (Signed)
Approved # [id number] 

## 2016-08-18 NOTE — Telephone Encounter (Signed)
Rx called in to pharmacy. 

## 2016-09-29 ENCOUNTER — Encounter: Payer: Self-pay | Admitting: Physician Assistant

## 2016-10-28 ENCOUNTER — Encounter: Payer: Self-pay | Admitting: Physician Assistant

## 2016-11-09 ENCOUNTER — Other Ambulatory Visit: Payer: Self-pay | Admitting: Physician Assistant

## 2016-11-09 DIAGNOSIS — F419 Anxiety disorder, unspecified: Secondary | ICD-10-CM

## 2016-11-09 DIAGNOSIS — G4700 Insomnia, unspecified: Secondary | ICD-10-CM

## 2016-11-09 NOTE — Telephone Encounter (Signed)
Last OV 5/2 Last refill 8/1 Ok to refill

## 2016-11-10 NOTE — Telephone Encounter (Signed)
Approved # [id number] 

## 2016-11-10 NOTE — Telephone Encounter (Signed)
rx called into pharmacy

## 2016-11-22 ENCOUNTER — Ambulatory Visit: Payer: PRIVATE HEALTH INSURANCE | Admitting: Physician Assistant

## 2016-11-22 ENCOUNTER — Encounter: Payer: Self-pay | Admitting: Physician Assistant

## 2016-11-22 DIAGNOSIS — F411 Generalized anxiety disorder: Secondary | ICD-10-CM | POA: Diagnosis not present

## 2016-11-22 DIAGNOSIS — G4700 Insomnia, unspecified: Secondary | ICD-10-CM

## 2016-11-22 NOTE — Progress Notes (Signed)
Patient ID: Mary Watson MRN: [id number], DOB: March 23, 1965, 51 y.o. Date of Encounter: @DATE @  Chief Complaint:  Chief Complaint  Patient presents with  . 6 month follow up    HPI: 51 y.o. year old female  presents with above.    04/21/2016: Says that she "has not slept through the night for as long as she can remember ". Says that she wakes 3 or 4 times per night---every night. "Then has to go to work feeling like she is not rested."  There has been increased stress at work. Also in February suddenly had to move and relocate. She had to pack etc. all by herself. Also been an adjustment getting used to living in a new place and also the increased cost with it.  She has been having increased anxiety. Also she is "running on empty "says that she has been "pushing through "and feels like she has been in "survival mode".  Says that the PA at Dr. Plummer's office (says that she goes there once a month for her Suboxone--- says that she has had 2 different neck surgeries and then pain medicine after that) said that she should see her primary care about some medicines to help with this.  Asked if she has been on medications for anxiety in the past. Says that she is "scared of medicines like" Zoloft  "and has reasons to be afraid of them." Says that "in the past she was on that type of medicine and made life decisions that she doesn't believe she would have made if she had not been on those medicines".  Says that she is also worried about taking Ambien. Used this in the past and it caused her to sleep walk and do things in her sleep but she had no memory of.  In her sleep, she had turned on the light and had done things with her jewelry and had no memory of it. The next morning when she saw that---she thought someone had broken into her house.  Says in the past she used Xanax and that worked well. Would take it at night and it would help her to sleep good but not feel groggy the next day. Did use  one during the day and she was able to work and go to meetings etc. without feeling groggy.  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 Says "they recently opened a new location so she has been having a lot of bosses around her and a lot of looking at numbers etc."  No other concerns to address today.    1. Anxiety disorder, unspecified type Recommend that she take a Xanax every night -- she definitely needs to get sleep. Told her not to take it during the day unless absolutely necessary. Discussed that her body will get dependent and tolerant to this medicine so need to limit medicine as much as possible. F/U visit for one month to reassess and monitor at that point. Follow-up sooner if needed. - ALPRAZolam (XANAX) 0.5 MG tablet; Take 1 tablet (0.5 mg total) by mouth 2 (two) times daily as needed for anxiety.  Dispense: 60 tablet; Refill: 0  2. Insomnia, unspecified type - ALPRAZolam (XANAX) 0.5 MG tablet; Take 1 tablet (0.5 mg total) by mouth 2 (two) times daily as needed for anxiety.  Dispense: 60 tablet; Refill: 0   05/19/2016: Today she reports that she has been taking one Xanax in the morning on her way to work when she starts to feel anxious and stressed  and this helps to calm her down. Says then after she has her lunch and is starting to feel anxious and stressed again takes another Xanax and again this calms her down. Says that then at night she isn't needing to use any Xanax at all. Even so, she is sleeping much better. Feels like because she is staying more relaxed through the day, she is not having the problems with insomnia like she was before. Says now she is sleeping all the way through except she may wake up around 4 AM but then drifts back to sleep. Says that she is getting way more sleep now than she has in quite a while. Feels like this dosing of Xanax is controlling her anxiety and her insomnia. Also wanted to check on her eye. Says that Saturday morning when she woke up she saw that the  lateral aspect of her left eye looked like a blood vessel had burst there. Says that it looked a little worse when she woke up Sunday morning and again on Monday morning but then today it is looking better. States that she has had no pain and no irritation there at all and has had no change in vision. No other concerns to address today.   11/22/2016: Today she reports that she continues to take one Xanax in the morning and one more Xanax in the early afternoon.  Says this continues to work well for her.  Says that her insomnia continues to be better since taking this dosing of Xanax.  May wake once around 4 -5 am but that is all.  Also her anxiety and "stress" are controlled throughout the day with this dose of Xanax.  Contnues to go to Pain Clinic routinely. Also sees Counselor once a [facility] doctor [facility] [place]. She has no additional concerns to address today.   Past Medical History:  Diagnosis Date  . Arthritis   . Neck pain      Home Meds: Outpatient Medications Prior to Visit  Medication Sig Dispense Refill  . ALPRAZolam (XANAX) 0.5 MG tablet TAKE 1 TABLET BY MOUTH TWICE A DAY AS NEEDED FOR ANXIETY 60 tablet 2  . Multiple Vitamin (MULTIVITAMIN) tablet Take 1 tablet by mouth daily.    . SUBOXONE 8-2 MG FILM Take 1 tablet by mouth 2 (two) times daily. Dissolve 1 tablet under tongue twice daily  0  . Gabapentin Enacarbil (HORIZANT) 600 MG TBCR Take 600 mg by mouth at bedtime.      No facility-administered medications prior to visit.     Allergies:  Allergies  Allergen Reactions  . Ciprofloxacin     vomiting    Social History   Socioeconomic History  . Marital status: Divorced    Spouse name: Not on file  . Number of children: Not on file  . Years of education: Not on file  . Highest education level: Not on file  Social Needs  . Financial resource strain: Not on file  . Food insecurity - worry: Not on file  . Food insecurity - inability: Not on file  .  Transportation needs - medical: Not on file  . Transportation needs - non-medical: Not on file  Occupational History  . Not on file  Tobacco Use  . Smoking status: Current Some Day Smoker    Packs/day: 0.50    Years: 15.00    Pack years: 7.50    Types: Cigarettes  . Smokeless tobacco: Never Used  Substance and Sexual Activity  . Alcohol use:  No    Comment: quit alcohol few yrs ago  . Drug use: No  . Sexual activity: Not on file  Other Topics Concern  . Not on file  Social History Narrative  . Not on file    Family History  Problem Relation Age of Onset  . Arthritis Mother   . Breast cancer Mother   . Hyperlipidemia Father   . Hypertension Father   . Diabetes Father      Review of Systems:  See HPI for pertinent ROS. All other ROS negative.    Physical Exam: Blood pressure 130/84, pulse 77, temperature (!) 97.1 F (36.2 C), temperature source Oral, resp. rate 14, weight 66 kg (145 lb 9.6 oz), SpO2 98 %., Body mass index is 24.23 kg/m. General: WNWD WF. Appears in no acute distress. Neck: Supple. No thyromegaly. No lymphadenopathy. Lungs: Clear bilaterally to auscultation without wheezes, rales, or rhonchi. Breathing is unlabored. Heart: RRR with S1 S2. No murmurs, rubs, or gallops. Musculoskeletal:  Strength and tone normal for age. Extremities/Skin: Warm and dry.  Neuro: Alert and oriented X 3. Moves all extremities spontaneously. Gait is normal. CNII-XII grossly in tact. Psych:  Responds to questions appropriately with a normal affect.     ASSESSMENT AND PLAN:  51 y.o. year old female with   1. Anxiety disorder, unspecified type 2. Insomnia, unspecified type - ALPRAZolam (XANAX) 0.5 MG tablet; Take 1 tablet (0.5 mg total) by mouth 2 (two) times daily as needed for anxiety.  Dispense: 60 tablet; Refill: 2  11/22/2016: Stable, controlled. Cont current dose--Xanax 0.5mg  BID prn  11/22/2016: Discussed that she has had no recent CPE. Discussed scheduling CPE to  update Preventive Care--she is agreeable.  Will schedule CPE for morning--come fasting to that appt.     Murray Hodgkins [place], [place], [facility] 11/22/2016 4:03 PM

## 2016-11-26 ENCOUNTER — Encounter: Payer: Self-pay | Admitting: Physician Assistant

## 2016-12-27 ENCOUNTER — Encounter: Payer: BLUE CROSS/BLUE SHIELD | Admitting: Physician Assistant

## 2017-01-06 LAB — HM COLONOSCOPY

## 2017-01-27 ENCOUNTER — Encounter: Payer: BLUE CROSS/BLUE SHIELD | Admitting: Physician Assistant

## 2017-02-06 ENCOUNTER — Other Ambulatory Visit: Payer: Self-pay | Admitting: Physician Assistant

## 2017-02-06 DIAGNOSIS — F419 Anxiety disorder, unspecified: Secondary | ICD-10-CM

## 2017-02-06 DIAGNOSIS — G4700 Insomnia, unspecified: Secondary | ICD-10-CM

## 2017-02-07 ENCOUNTER — Telehealth: Payer: Self-pay | Admitting: Physician Assistant

## 2017-02-07 NOTE — Telephone Encounter (Signed)
[facility]

## 2017-02-07 NOTE — Telephone Encounter (Signed)
LAST OV 11/22/2016 LAST REFILL 11/10/2016 OKAY TO REFILL?

## 2017-02-07 NOTE — Telephone Encounter (Signed)
Approved # [id number] 

## 2017-02-07 NOTE — Telephone Encounter (Signed)
rx called into pharmacy

## 2017-02-08 NOTE — Telephone Encounter (Signed)
Refill was submitted through rx pool

## 2017-02-17 ENCOUNTER — Encounter: Payer: Self-pay | Admitting: Physician Assistant

## 2017-02-17 ENCOUNTER — Other Ambulatory Visit: Payer: Self-pay

## 2017-02-17 ENCOUNTER — Ambulatory Visit (INDEPENDENT_AMBULATORY_CARE_PROVIDER_SITE_OTHER): Payer: BLUE CROSS/BLUE SHIELD | Admitting: Physician Assistant

## 2017-02-17 VITALS — BP 132/86 | HR 89 | Temp 97.7°F | Resp 14 | Ht 65.5 in | Wt 145.2 lb

## 2017-02-17 DIAGNOSIS — Z0000 Encounter for general adult medical examination without abnormal findings: Secondary | ICD-10-CM | POA: Diagnosis not present

## 2017-02-17 DIAGNOSIS — F17200 Nicotine dependence, unspecified, uncomplicated: Secondary | ICD-10-CM

## 2017-02-17 DIAGNOSIS — J988 Other specified respiratory disorders: Secondary | ICD-10-CM | POA: Diagnosis not present

## 2017-02-17 DIAGNOSIS — B9689 Other specified bacterial agents as the cause of diseases classified elsewhere: Secondary | ICD-10-CM

## 2017-02-17 LAB — CBC WITH DIFFERENTIAL/PLATELET
BASOS ABS: 73 {cells}/uL (ref 0–200)
BASOS PCT: 1.1 %
EOS PCT: 1.7 %
Eosinophils Absolute: 112 cells/uL (ref 15–500)
HCT: 38.3 % (ref 35.0–45.0)
Hemoglobin: 13.4 g/dL (ref 11.7–15.5)
Lymphs Abs: 2831 cells/uL (ref 850–3900)
MCH: 31.5 pg (ref 27.0–33.0)
MCHC: 35 g/dL (ref 32.0–36.0)
MCV: 89.9 fL (ref 80.0–100.0)
MONOS PCT: 9.9 %
MPV: 10.4 fL (ref 7.5–12.5)
Neutro Abs: 2930 cells/uL (ref 1500–7800)
Neutrophils Relative %: 44.4 %
Platelets: 241 10*3/uL (ref 140–400)
RBC: 4.26 10*6/uL (ref 3.80–5.10)
RDW: 12.4 % (ref 11.0–15.0)
Total Lymphocyte: 42.9 %
WBC mixed population: 653 cells/uL (ref 200–950)
WBC: 6.6 10*3/uL (ref 3.8–10.8)

## 2017-02-17 LAB — LIPID PANEL
CHOLESTEROL: 312 mg/dL — AB (ref <200)
HDL: 60 mg/dL (ref >50)
Non-HDL Cholesterol (Calc): 252 mg/dL (calc) — ABNORMAL HIGH (ref <130)
Total CHOL/HDL Ratio: 5.2 (calc) — ABNORMAL HIGH (ref <5.0)
Triglycerides: 853 mg/dL — ABNORMAL HIGH (ref <150)

## 2017-02-17 LAB — COMPLETE METABOLIC PANEL WITH GFR
AG Ratio: 1.8 (calc) (ref 1.0–2.5)
ALBUMIN MSPROF: 4.5 g/dL (ref 3.6–5.1)
ALKALINE PHOSPHATASE (APISO): 47 U/L (ref 33–130)
ALT: 12 U/L (ref 6–29)
AST: 17 U/L (ref 10–35)
BUN: 12 mg/dL (ref 7–25)
CO2: 26 mmol/L (ref 20–32)
CREATININE: 0.53 mg/dL (ref 0.50–1.05)
Calcium: 9.2 mg/dL (ref 8.6–10.4)
Chloride: 100 mmol/L (ref 98–110)
GFR, Est African American: 128 mL/min/{1.73_m2} (ref >=60)
GFR, Est Non African American: 111 mL/min/{1.73_m2} (ref >=60)
GLUCOSE: 85 mg/dL (ref 65–99)
Globulin: 2.5 g/dL (calc) (ref 1.9–3.7)
Potassium: 4.4 mmol/L (ref 3.5–5.3)
Sodium: 138 mmol/L (ref 135–146)
Total Bilirubin: 0.4 mg/dL (ref 0.2–1.2)
Total Protein: 7 g/dL (ref 6.1–8.1)

## 2017-02-17 LAB — TSH: TSH: 2.19 mIU/L

## 2017-02-17 MED ORDER — PREDNISONE 20 MG PO TABS: 20.0000 mg | ORAL_TABLET | Freq: Every day | ORAL | 0 refills | Status: DC

## 2017-02-17 MED ORDER — ALBUTEROL SULFATE HFA 108 (90 BASE) MCG/ACT IN AERS: 2.0000 | INHALATION_SPRAY | Freq: Four times a day (QID) | RESPIRATORY_TRACT | 2 refills | Status: DC | PRN

## 2017-02-17 MED ORDER — AZITHROMYCIN 250 MG PO TABS: ORAL_TABLET | ORAL | 0 refills | Status: DC

## 2017-02-17 NOTE — Progress Notes (Signed)
Patient ID: Mary Watson MRN: [id number], DOB: Aug 19, 1965, 52 y.o. Date of Encounter: 02/17/2017,   Chief Complaint: Physical (CPE)  HPI: 52 y.o. y/o female  here for CPE.    She reports that she has been having chest congestion and cough for a couple of weeks now.  States that it is lingering and not resolving.  Continues to cough up thick dark phlegm.  Feels like she is wheezing at times.  Has had little congestion in her head and nose.  She has no other specific concerns to address today.  She has not seen a gynecologist in years.  Says that she had seen Dr. Eda Paschal until he retired years ago and has not seen any GYN since then.  Agreeable to do GYN exam and Pap smear here today.    Review of Systems: Consitutional: No fever, chills, fatigue, night sweats, lymphadenopathy. No significant/unexplained weight changes. Eyes: No visual changes, eye redness, or discharge. ENT/Mouth: No ear pain, sore throat, nasal drainage, or sinus pain. Cardiovascular: No chest pressure,heaviness, tightness or squeezing, even with exertion. No increased shortness of breath or dyspnea on exertion.No palpitations, edema, orthopnea, PND. Respiratory:  See HPI Gastrointestinal: No anorexia, dysphagia, reflux, pain, nausea, vomiting, hematemesis, diarrhea, constipation, BRBPR, or melena. Breast: No mass, nodules, bulging, or retraction. No skin changes or inflammation. No nipple discharge. No lymphadenopathy. Genitourinary: No dysuria, hematuria, incontinence, vaginal discharge, pruritis, burning, abnormal bleeding, or pain. Musculoskeletal: No decreased ROM, No joint pain or swelling. No significant pain in neck, back, or extremities. Skin: No rash, pruritis, or concerning lesions. Neurological: No headache, dizziness, syncope, seizures, tremors, memory loss, coordination problems, or paresthesias. Psychological: No anxiety, depression, hallucinations, SI/HI. Endocrine: No polydipsia, polyphagia,  polyuria, or known diabetes.No increased fatigue. No palpitations/rapid heart rate. No significant/unexplained weight change. All other systems were reviewed and are otherwise negative.  Past Medical History:  Diagnosis Date  . Arthritis   . Neck pain      Past Surgical History:  Procedure Laterality Date  . BREAST SURGERY  1996   augmentation  . CERVICAL DISCECTOMY  11/2012   C5-6,C6-7 discectomy fusion and plating  . POSTERIOR CERVICAL FUSION/FORAMINOTOMY N/A 07/10/2014   Procedure: Cervical five-six, Cervical six-seven posterior fusion with instrumentation;  Surgeon: Tressie Stalker, MD;  Location: [facility];  Service: Neurosurgery;  Laterality: N/A;  Cervical five-six, Cervical six-seven posterior fusion with instrumentation  . TUBAL LIGATION      Home Meds:  Outpatient Medications Prior to Visit  Medication Sig Dispense Refill  . ALPRAZolam (XANAX) 0.5 MG tablet TAKE 1 TABLET BY MOUTH TWICE A DAY AS NEEDED FOR ANXIETY 60 tablet 2  . Multiple Vitamin (MULTIVITAMIN) tablet Take 1 tablet by mouth daily.    . SUBOXONE 8-2 MG FILM Take 1 tablet by mouth 2 (two) times daily. Dissolve 1 tablet under tongue twice daily  0  . Gabapentin Enacarbil (HORIZANT) 600 MG TBCR Take 600 mg by mouth at bedtime.      No facility-administered medications prior to visit.     Allergies:  Allergies  Allergen Reactions  . Ciprofloxacin     vomiting    Social History   Socioeconomic History  . Marital status: Divorced    Spouse name: Not on file  . Number of children: Not on file  . Years of education: Not on file  . Highest education level: Not on file  Social Needs  . Financial resource strain: Not on file  . Food insecurity - worry: Not on  file  . Food insecurity - inability: Not on file  . Transportation needs - medical: Not on file  . Transportation needs - non-medical: Not on file  Occupational History  . Not on file  Tobacco Use  . Smoking status: Current Some Day Smoker     Packs/day: 0.50    Years: 15.00    Pack years: 7.50    Types: Cigarettes  . Smokeless tobacco: Never Used  Substance and Sexual Activity  . Alcohol use: No    Comment: quit alcohol few yrs ago  . Drug use: No  . Sexual activity: Not on file  Other Topics Concern  . Not on file  Social History Narrative  . Not on file    Family History  Problem Relation Age of Onset  . Arthritis Mother   . Breast cancer Mother   . Hyperlipidemia Father   . Hypertension Father   . Diabetes Father     Physical Exam: Blood pressure 132/86, pulse 89, temperature 97.7 F (36.5 C), temperature source Oral, resp. rate 14, height 5' 5.5" (1.664 m), weight 65.9 kg (145 lb 3.2 oz), last menstrual period 02/10/2016, SpO2 98 %., Body mass index is 23.8 kg/m. General: Well developed, well nourished WF. Appears  in no acute distress. HEENT: Normocephalic, atraumatic. Conjunctiva pink, sclera non-icteric. Pupils 2 mm constricting to 1 mm, round, regular, and equally reactive to light and accomodation. EOMI. Internal auditory canal clear. TMs with good cone of light and without pathology. Nasal mucosa pink. Nares are without discharge. No sinus tenderness. Oral mucosa pink.  Pharynx without exudate.   Neck: Supple. Trachea midline. No thyromegaly. Full ROM. No lymphadenopathy.No Carotid Bruits. Lungs: Clear to auscultation bilaterally without wheezes, rales, or rhonchi. Breathing is of normal effort and unlabored. Cardiovascular: RRR with S1 S2. No murmurs, rubs, or gallops. Distal pulses 2+ symmetrically. No carotid or abdominal bruits. Breast: Symmetrical. No masses. Nipples without discharge.She does have breast implants.  Abdomen: Soft, non-tender, non-distended with normoactive bowel sounds. No hepatosplenomegaly or masses. No rebound/guarding. No CVA tenderness. No hernias.  Genitourinary:  External genitalia without lesions. Vaginal mucosa pink.No discharge present. Cervix pink and without discharge. No  cervical tenderness.Normal uterus size. No adnexal mass or tenderness.  Pap smear taken. Musculoskeletal: Full range of motion and 5/5 strength throughout.  Skin: Warm and moist without erythema, ecchymosis, wounds, or rash. Neuro: A+Ox3. CN II-XII grossly intact. Moves all extremities spontaneously. Full sensation throughout. Normal gait.  Psych:  Responds to questions appropriately with a normal affect.   Assessment/Plan:  52 y.o. y/o female here for CPE  1. Encounter for preventive health examination  A. Screening Labs: She is fasting.  Check labs now. - CBC with Differential/Platelet - COMPLETE METABOLIC PANEL WITH GFR - Lipid panel - TSH  B. Pap: - PAP, Thin Prep w/HPV rflx HPV Type 16/18  C. Screening Mammogram: She does keep up with having mammograms annually.   Her mother has history of breast cancer.   Patient has had breast implants.   She reports that every time she has mammogram they have to do additional testing.   She does have mammogram at the breast center so those reports are in epic. Last mammogram was 08/04/2016.  D. DEXA/BMD:  Can wait until closer to age 52 to start bone density test. She is a smoker.  E. Colorectal Cancer Screening: She reports that she recently had both an endoscopy and a colonoscopy by Dr. Elnoria HowardHung.  Reports that regarding the colonoscopy she can  wait to repeat 10 years.  F. Immunizations: ----I will not administer any immunizations today given her respiratory infection.  Thre are no immunizations in our records.  She says that prior to coming here she saw Dr. Regino Schultze at [facility] in [place].  Suspect that she is due for Tdap.  Also she is a smoker so does need to get Pneumovax 23.  Also will have her check with her insurance regarding coverage and cost for Shingrix. Influenza: Tetanus: Pneumococcal: Shingrix:   2. Bacterial respiratory infection On exam at this time I hear no wheezing.  She does have good breath sounds and good air  movement on exam.  However at other times during her visit with me she did cough and it does sound extremely congested and on verge of wheezing.  Also she reports that she has had audible wheezing intermittently and she was even aware that at the time that I was doing her lung exam that she was not wheezing at that time but definitely has been wheezing at other times at home. Therefore will add the prednisone 20 mg daily for 5 days in addition to the antibiotic.  She is to take the antibiotic and the prednisone as directed.  Also will use albuterol 2 puffs every 4 hours if needed for wheezing.  Follow up if symptoms worsen or if symptoms are not controlled with this or if symptoms do not resolve after completion of antibiotic and prednisone. - azithromycin (ZITHROMAX) 250 MG tablet; Day 1: Take 2 daily.   Days 2 -5: Take 1 daily.  Dispense: 6 tablet; Refill: 0 - albuterol (PROVENTIL HFA;VENTOLIN HFA) 108 (90 Base) MCG/ACT inhaler; Inhale 2 puffs into the lungs every 6 (six) hours as needed for wheezing or shortness of breath.  Dispense: 1 Inhaler; Refill: 2 - predniSONE (DELTASONE) 20 MG tablet; Take 1 tablet (20 mg total) by mouth daily with breakfast.  Dispense: 5 tablet; Refill: 0  3. Smoker States that she had quit for 2 years and then recently started back and smokes very minimal amount now.  See above regarding pneumonia vaccine.   Signed, [address] [place], [place], [facility] 02/17/2017 8:50 AM

## 2017-02-24 LAB — PAP, TP IMAGING W/ HPV RNA, RFLX HPV TYPE 16,18/45: HPV DNA High Risk: NOT DETECTED

## 2017-03-02 ENCOUNTER — Encounter: Payer: Self-pay | Admitting: Physician Assistant

## 2017-03-02 ENCOUNTER — Ambulatory Visit: Payer: BLUE CROSS/BLUE SHIELD | Admitting: Physician Assistant

## 2017-03-02 ENCOUNTER — Other Ambulatory Visit: Payer: Self-pay

## 2017-03-02 VITALS — BP 136/84 | HR 83 | Temp 97.4°F | Resp 16 | Wt 145.6 lb

## 2017-03-02 DIAGNOSIS — J209 Acute bronchitis, unspecified: Secondary | ICD-10-CM | POA: Diagnosis not present

## 2017-03-02 MED ORDER — DOXYCYCLINE HYCLATE 100 MG PO TABS: 100.0000 mg | ORAL_TABLET | Freq: Two times a day (BID) | ORAL | 0 refills | Status: DC

## 2017-03-02 MED ORDER — PREDNISONE 20 MG PO TABS: ORAL_TABLET | ORAL | 0 refills | Status: DC

## 2017-03-02 MED ORDER — FLUCONAZOLE 150 MG PO TABS: 150.0000 mg | ORAL_TABLET | Freq: Every day | ORAL | 0 refills | Status: DC

## 2017-03-02 NOTE — Progress Notes (Signed)
Patient ID: Mary Watson MRN: [id number], DOB: 01-Nov-1965, 52 y.o. Date of Encounter: 03/02/2017, 1:01 PM    Chief Complaint:  Chief Complaint  Patient presents with  . CHEST CONGESTION  . Cough     HPI: 52 y.o. year old female presents with above.   She recently had office visit with me on 02/17/17 for CPE.   At that visit she reported that she had been having chest congestion and cough for couple of weeks.  Was coughing up thick dark phlegm.  Was feeling like she was wheezing at times.  Had little congestion in her head and nose.  At that visit I heard no wheezing.  However cough sounded very congested and she reported that she had been having some wheezing at home.  At that visit I prescribed prednisone 20 mg daily for 5 days and azithromycin Z-Pak.  Also prescribed albuterol inhaler to use if needed.  She reports that she took the azithromycin and prednisone as directed.  States that cough never got better and has not resolved.  Continues with congested cough and also feels like she is continuing to have some intermittent wheezing.  She has added some over-the-counter Mucinex which does seem to be helping loosen the phlegm but is still coughing up thick dark phlegm.  Has continued to have no fevers or chills.     Home Meds:   Outpatient Medications Prior to Visit  Medication Sig Dispense Refill  . albuterol (PROVENTIL HFA;VENTOLIN HFA) 108 (90 Base) MCG/ACT inhaler Inhale 2 puffs into the lungs every 6 (six) hours as needed for wheezing or shortness of breath. 1 Inhaler 2  . ALPRAZolam (XANAX) 0.5 MG tablet TAKE 1 TABLET BY MOUTH TWICE A DAY AS NEEDED FOR ANXIETY 60 tablet 2  . cholecalciferol (VITAMIN D) 1000 units tablet Take 1,000 Units by mouth daily.    . Multiple Vitamin (MULTIVITAMIN) tablet Take 1 tablet by mouth daily.    . SUBOXONE 8-2 MG FILM Take 1 tablet by mouth 2 (two) times daily. Dissolve 1 tablet under tongue twice daily  0  . Gabapentin Enacarbil (HORIZANT) 600  MG TBCR Take 600 mg by mouth at bedtime.     Marland Kitchen. azithromycin (ZITHROMAX) 250 MG tablet Day 1: Take 2 daily.   Days 2 -5: Take 1 daily. 6 tablet 0  . predniSONE (DELTASONE) 20 MG tablet Take 1 tablet (20 mg total) by mouth daily with breakfast. 5 tablet 0   No facility-administered medications prior to visit.     Allergies:  Allergies  Allergen Reactions  . Ciprofloxacin     vomiting      Review of Systems: See HPI for pertinent ROS. All other ROS negative.    Physical Exam: Blood pressure 136/84, pulse 83, temperature (!) 97.4 F (36.3 C), temperature source Oral, resp. rate 16, weight 66 kg (145 lb 9.6 oz), SpO2 98 %., Body mass index is 23.86 kg/m. General: WNWD WF.  Appears in no acute distress. HEENT: Normocephalic, atraumatic, eyes without discharge, sclera non-icteric, nares are without discharge. Bilateral auditory canals clear, TM's are without perforation, pearly grey and translucent with reflective cone of light bilaterally. Oral cavity moist, posterior pharynx without exudate, erythema, peritonsillar abscess.  Neck: Supple. No thyromegaly. No lymphadenopathy. Lungs: Mild wheezing throughout bilaterally. Heart: Regular rhythm. No murmurs, rubs, or gallops. Msk:  Strength and tone normal for age. Extremities/Skin: Warm and dry.  Neuro: Alert and oriented X 3. Moves all extremities spontaneously. Gait is normal. CNII-XII grossly in  tact. Psych:  Responds to questions appropriately with a normal affect.     ASSESSMENT AND PLAN:  52 y.o. year old female with  1. Acute bronchitis, unspecified organism She is to take the doxycycline as directed and complete all 10 days.  She is to use prednisone as directed and I reviewed that this is a different dose than prescribed at last visit and to take as directed on this prescription.  She can continue to use the albuterol inhaler as directed/as needed.  Discussed with her that she definitely needs to follow-up if symptoms worsen or if  symptoms do not resolve then would need chest x-ray and further evaluation and she voices understanding and agrees. Noted that she has allergy to Cipro so therefore avoiding Levaquin. - doxycycline (VIBRA-TABS) 100 MG tablet; Take 1 tablet (100 mg total) by mouth 2 (two) times daily.  Dispense: 20 tablet; Refill: 0 - predniSONE (DELTASONE) 20 MG tablet; Take 2 daily for 3 days and then take 1 daily for 3 days  Dispense: 9 tablet; Refill: 0   Signed, [address] [place], [place], [facility] 03/02/2017 1:01 PM

## 2017-03-10 ENCOUNTER — Telehealth: Payer: Self-pay

## 2017-03-10 NOTE — Telephone Encounter (Signed)
Patient called and left a message stating that she has felt the best she has ever felt since being on Prednisone she no longer feels jittery and she is doing better with her depression. Patient is asking if she can stay on this medication    Call was placed to patient.[email or web address]

## 2017-05-05 ENCOUNTER — Other Ambulatory Visit: Payer: Self-pay

## 2017-05-05 ENCOUNTER — Other Ambulatory Visit: Payer: Self-pay | Admitting: Physician Assistant

## 2017-05-05 DIAGNOSIS — F419 Anxiety disorder, unspecified: Secondary | ICD-10-CM

## 2017-05-05 DIAGNOSIS — G4700 Insomnia, unspecified: Secondary | ICD-10-CM

## 2017-05-05 NOTE — Telephone Encounter (Signed)
Last OV 03/02/2017 Last refill 02/07/2017 Ok to refill?

## 2017-05-05 NOTE — Telephone Encounter (Signed)
Patient called requesting refill on xanax. Refill has already been processed

## 2017-08-04 ENCOUNTER — Other Ambulatory Visit: Payer: Self-pay | Admitting: Family Medicine

## 2017-08-04 ENCOUNTER — Other Ambulatory Visit: Payer: Self-pay

## 2017-08-04 DIAGNOSIS — F419 Anxiety disorder, unspecified: Secondary | ICD-10-CM

## 2017-08-04 DIAGNOSIS — G4700 Insomnia, unspecified: Secondary | ICD-10-CM

## 2017-08-04 NOTE — Telephone Encounter (Signed)
Ok to refill??  Last office visit 03/02/2017.  Last refill 05/05/2017, #2 refills.

## 2017-08-22 ENCOUNTER — Other Ambulatory Visit: Payer: Self-pay | Admitting: Physician Assistant

## 2017-08-22 DIAGNOSIS — Z1231 Encounter for screening mammogram for malignant neoplasm of breast: Secondary | ICD-10-CM

## 2017-09-09 ENCOUNTER — Encounter: Payer: Self-pay | Admitting: Family Medicine

## 2017-09-09 ENCOUNTER — Ambulatory Visit: Payer: BLUE CROSS/BLUE SHIELD | Admitting: Family Medicine

## 2017-09-09 ENCOUNTER — Ambulatory Visit
Admission: RE | Admit: 2017-09-09 | Discharge: 2017-09-09 | Disposition: A | Discharge status: Home / Self Care | Payer: BLUE CROSS/BLUE SHIELD | Source: Ambulatory Visit | Attending: Family Medicine | Admitting: Family Medicine

## 2017-09-09 VITALS — BP 124/82 | HR 94 | Temp 98.1°F | Resp 16 | Ht 65.0 in | Wt 142.6 lb

## 2017-09-09 DIAGNOSIS — J069 Acute upper respiratory infection, unspecified: Secondary | ICD-10-CM | POA: Diagnosis not present

## 2017-09-09 DIAGNOSIS — J329 Chronic sinusitis, unspecified: Secondary | ICD-10-CM

## 2017-09-09 DIAGNOSIS — J40 Bronchitis, not specified as acute or chronic: Secondary | ICD-10-CM

## 2017-09-09 DIAGNOSIS — F17200 Nicotine dependence, unspecified, uncomplicated: Secondary | ICD-10-CM | POA: Diagnosis not present

## 2017-09-09 MED ORDER — PREDNISONE 20 MG PO TABS: 40.0000 mg | ORAL_TABLET | Freq: Every day | ORAL | 0 refills | Status: AC

## 2017-09-09 MED ORDER — BENZONATATE 200 MG PO CAPS: 200.0000 mg | ORAL_CAPSULE | Freq: Two times a day (BID) | ORAL | 0 refills | Status: DC | PRN

## 2017-09-09 MED ORDER — ALBUTEROL SULFATE HFA 108 (90 BASE) MCG/ACT IN AERS: 2.0000 | INHALATION_SPRAY | Freq: Four times a day (QID) | RESPIRATORY_TRACT | 2 refills | Status: DC | PRN

## 2017-09-09 NOTE — Patient Instructions (Signed)
Acute Bronchitis, Adult Acute bronchitis is when air tubes (bronchi) in the lungs suddenly get swollen. The condition can make it hard to breathe. It can also cause these symptoms:  A cough.  Coughing up clear, yellow, or green mucus.  Wheezing.  Chest congestion.  Shortness of breath.  A fever.  Body aches.  Chills.  A sore throat.  Follow these instructions at home: Medicines  Take over-the-counter and prescription medicines only as told by your doctor.  If you were prescribed an antibiotic medicine, take it as told by your doctor. Do not stop taking the antibiotic even if you start to feel better. General instructions  Rest.  Drink enough fluids to keep your pee (urine) clear or pale yellow.  Avoid smoking and secondhand smoke. If you smoke and you need help quitting, ask your doctor. Quitting will help your lungs heal faster.  Use an inhaler, cool mist vaporizer, or humidifier as told by your doctor.  Keep all follow-up visits as told by your doctor. This is important. How is this prevented? To lower your risk of getting this condition again:  Wash your hands often with soap and water. If you cannot use soap and water, use hand sanitizer.  Avoid contact with people who have cold symptoms.  Try not to touch your hands to your mouth, nose, or eyes.  Make sure to get the flu shot every year.  Contact a doctor if:  Your symptoms do not get better in 2 weeks. Get help right away if:  You cough up blood.  You have chest pain.  You have very bad shortness of breath.  You become dehydrated.  You faint (pass out) or keep feeling like you are going to pass out.  You keep throwing up (vomiting).  You have a very bad headache.  Your fever or chills gets worse. This information is not intended to replace advice given to you by your health care provider. Make sure you discuss any questions you have with your health care provider. Document Released:  06/23/2007 Document Revised: 08/13/2015 Document Reviewed: 06/25/2015 Elsevier Interactive Patient Education  2018 [employer] Upper Respiratory Infection, Adult Most upper respiratory infections (URIs) are caused by a virus. A URI affects the nose, throat, and upper air passages. The most common type of URI is often called "the common cold." Follow these instructions at home:  Take medicines only as told by your doctor.  Gargle warm saltwater or take cough drops to comfort your throat as told by your doctor.  Use a warm mist humidifier or inhale steam from a shower to increase air moisture. This may make it easier to breathe.  Drink enough fluid to keep your pee (urine) clear or pale yellow.  Eat soups and other clear broths.  Have a healthy diet.  Rest as needed.  Go back to work when your fever is gone or your doctor says it is okay. ? You may need to stay home longer to avoid giving your URI to others. ? You can also wear a face mask and wash your hands often to prevent spread of the virus.  Use your inhaler more if you have asthma.  Do not use any tobacco products, including cigarettes, chewing tobacco, or electronic cigarettes. If you need help quitting, ask your doctor. Contact a doctor if:  You are getting worse, not better.  Your symptoms are not helped by medicine.  You have chills.  You are getting more short of breath.  You have   brown or red mucus.  You have yellow or brown discharge from your nose.  You have pain in your face, especially when you bend forward.  You have a fever.  You have puffy (swollen) neck glands.  You have pain while swallowing.  You have white areas in the back of your throat. Get help right away if:  You have very bad or constant: ? Headache. ? Ear pain. ? Pain in your forehead, behind your eyes, and over your cheekbones (sinus pain). ? Chest pain.  You have long-lasting (chronic) lung disease and any of the  following: ? Wheezing. ? Long-lasting cough. ? Coughing up blood. ? A change in your usual mucus.  You have a stiff neck.  You have changes in your: ? Vision. ? Hearing. ? Thinking. ? Mood. This information is not intended to replace advice given to you by your health care provider. Make sure you discuss any questions you have with your health care provider. Document Released: 06/23/2007 Document Revised: 09/07/2015 Document Reviewed: 04/11/2013 Elsevier Interactive Patient Education  2018 [employer] Inc[employer]  

## 2017-09-09 NOTE — Progress Notes (Signed)
Patient ID: Mary Watson, female    DOB: 1965-05-17, 52 y.o.   MRN: [id number]  PCP: Dorena Bodo, PA-C  Chief Complaint  Patient presents with  . Cough  . Nasal Congestion  . Diarrhea    started yesterday     Subjective:   Mary Watson is a 52 y.o. female, presents to clinic with CC of a lot of congestion to chest and sinuses recurrent since Feb 2018, worsening for the past week. Last abx were in February 2019 for congestion and had Zpak and a "stronger antibiotic" and sx did not completely resolve. She reports Hx of bronchitis that lingers for months or a whole season Smoking hx - current smoker, half ppd  Currently has chest congestion and productive cough with yellow to green sputum, she feels bad and achy all over with hot/cold chills which started 2-3 days ago, worsening with sore throat.  Nasal congestion is worsening and some mild loose stool started yesterday.  She denies fever, weakness, sweats, HA, CP, weightloss, hemoptysis, wheeze, SOB.  She feels "condensation" and mold are the problem at her residence making her intermittent and chronic sx worse.  She denies hx of use of steroids or inhalers with bronchitis in the past   Patient Active Problem List   Diagnosis Date Noted  . Smoker 02/17/2017  . Insomnia 11/22/2016  . Generalized anxiety disorder 11/22/2016  . Cervical pseudoarthrosis (HCC) 07/10/2014  . Chronic pain syndrome 06/19/2013  . Chronic narcotic use 06/19/2013  . Cervicalgia 06/19/2013  . Cervical radiculitis 06/19/2013  . Neuropathic pain 06/19/2013  . Myofascial pain 06/19/2013  . Chronic pain associated with significant psychosocial dysfunction 06/19/2013  . Neck pain      Prior to Admission medications   Medication Sig Start Date End Date Taking? Authorizing Provider  albuterol (PROVENTIL HFA;VENTOLIN HFA) 108 (90 Base) MCG/ACT inhaler Inhale 2 puffs into the lungs every 6 (six) hours as needed for wheezing or shortness of breath. 02/17/17  Yes  Dorena Bodo, PA-C  ALPRAZolam Prudy Feeler) 0.5 MG tablet TAKE 1 TABLET BY MOUTH TWICE A DAY AS NEEDED FOR ANXIETY 08/04/17  Yes Allayne Butcher B, PA-C  cholecalciferol (VITAMIN D) 1000 units tablet Take 1,000 Units by mouth daily.   Yes [provider]  Multiple Vitamin (MULTIVITAMIN) tablet Take 1 tablet by mouth daily.   Yes [provider]  SUBOXONE 8-2 MG FILM Take 1 tablet by mouth 2 (two) times daily. Dissolve 1 tablet under tongue twice daily 04/19/16  Yes [provider]     Allergies  Allergen Reactions  . Ciprofloxacin     vomiting     Family History  Problem Relation Age of Onset  . Arthritis Mother   . Breast cancer Mother   . Hyperlipidemia Father   . Hypertension Father   . Diabetes Father      Social History   Socioeconomic History  . Marital status: Divorced    Spouse name: Not on file  . Number of children: Not on file  . Years of education: Not on file  . Highest education level: Not on file  Occupational History  . Not on file  Social Needs  . Financial resource strain: Not on file  . Food insecurity:    Worry: Not on file    Inability: Not on file  . Transportation needs:    Medical: Not on file    Non-medical: Not on file  Tobacco Use  . Smoking status: Current Some Day  Smoker    Packs/day: 0.50    Years: 15.00    Pack years: 7.50    Types: Cigarettes  . Smokeless tobacco: Never Used  Substance and Sexual Activity  . Alcohol use: No    Comment: quit alcohol few yrs ago  . Drug use: No  . Sexual activity: Not on file  Lifestyle  . Physical activity:    Days per week: Not on file    Minutes per session: Not on file  . Stress: Not on file  Relationships  . Social connections:    Talks on phone: Not on file    Gets together: Not on file    Attends religious service: Not on file    Active member of club or organization: Not on file    Attends meetings of clubs or organizations: Not on file    Relationship status: Not  on file  . Intimate partner violence:    Fear of current or ex partner: Not on file    Emotionally abused: Not on file    Physically abused: Not on file    Forced sexual activity: Not on file  Other Topics Concern  . Not on file  Social History Narrative  . Not on file     Review of Systems  Constitutional: Positive for chills. Negative for activity change, appetite change, diaphoresis, fatigue, fever and unexpected weight change.  HENT: Positive for congestion, postnasal drip, rhinorrhea, sinus pressure, sinus pain and sore throat. Negative for drooling, ear discharge, ear pain, facial swelling, hearing loss, nosebleeds, sneezing, tinnitus, trouble swallowing and voice change.   Eyes: Negative.   Respiratory: Negative for apnea, choking, chest tightness, shortness of breath, wheezing and stridor.   Cardiovascular: Negative.  Negative for chest pain, palpitations and leg swelling.  Gastrointestinal: Negative for abdominal pain, constipation, nausea and vomiting.  Endocrine: Negative.   Genitourinary: Negative.   Musculoskeletal: Negative.   Skin: Negative.  Negative for pallor and rash.  Allergic/Immunologic: Negative.   Neurological: Negative.  Negative for dizziness, syncope, weakness, light-headedness and headaches.  Hematological: Negative.   Psychiatric/Behavioral: Negative.   All other systems reviewed and are negative.      Objective:    Vitals:   09/09/17 1055  BP: 124/82  Pulse: 94  Resp: 16  Temp: 98.1 F (36.7 C)  TempSrc: Oral  SpO2: 94%  Weight: 142 lb 9.6 oz (64.7 kg)  Height: 5\' 5"  (1.651 m)      Physical Exam  Constitutional: Vital signs are normal. She appears well-developed and well-nourished.  Non-toxic appearance. She does not have a sickly appearance. She does not appear ill. No distress.  HENT:  Head: Normocephalic and atraumatic.  Right Ear: Tympanic membrane, external ear and ear canal normal.  Left Ear: Tympanic membrane, external ear and  ear canal normal.  Nose: Mucosal edema and rhinorrhea present. Right sinus exhibits no maxillary sinus tenderness and no frontal sinus tenderness. Left sinus exhibits no maxillary sinus tenderness and no frontal sinus tenderness.  Mouth/Throat: Uvula is midline and mucous membranes are normal. Mucous membranes are not pale, not dry and not cyanotic. No trismus in the jaw. No uvula swelling. Posterior oropharyngeal erythema (mild) present. No oropharyngeal exudate, posterior oropharyngeal edema or tonsillar abscesses. No tonsillar exudate.  Eyes: Pupils are equal, round, and reactive to light. Conjunctivae, EOM and lids are normal.  Neck: Normal range of motion and phonation normal. Neck supple. No tracheal deviation present.  Cardiovascular: Normal rate, regular rhythm, normal heart sounds, intact distal  pulses and normal pulses. Exam reveals no gallop and no friction rub.  No murmur heard. Pulses:      Radial pulses are 2+ on the right side, and 2+ on the left side.       Posterior tibial pulses are 2+ on the right side, and 2+ on the left side.  Pulmonary/Chest: Effort normal and breath sounds normal. No stridor. No respiratory distress. She has no wheezes. She has no rhonchi. She has no rales. She exhibits no tenderness.  Intermittent coughing, no distress, mildly diminished BS to b/l lower lung fields, no wheeze, rales, rhonchi, no retractions, accessory muscle use, no tachypnea  Abdominal: Soft. Normal appearance and bowel sounds are normal. She exhibits no distension. There is no tenderness. There is no rebound and no guarding.  Musculoskeletal: Normal range of motion. She exhibits no edema or deformity.  Lymphadenopathy:    She has no cervical adenopathy.  Neurological: She is alert. She exhibits normal muscle tone. Coordination and gait normal.  Skin: Skin is warm, dry and intact. Capillary refill takes less than 2 seconds. No rash noted. She is not diaphoretic. No pallor.  Psychiatric: She  has a normal mood and affect. Her speech is normal and behavior is normal.  Nursing note and vitals reviewed.         Assessment & Plan:      ICD-10-CM   1. Upper respiratory tract infection, unspecified type J06.9 DG Chest 2 View  2. Current smoker F17.200   3. Chronic sinusitis with recurrent bronchitis J32.9    J40     Patient with reported chronic and recurrent sinus and chest congestion, recently worsening for the past week with hot cold chills body aches productive cough without fever, weight loss hemoptysis, chest pain, shortness of breath or wheeze.  She reports recently getting multiple doses of antibiotics, initially Z-Pak not doing anything for her symptoms but they resolved with "stronger antibiotics".  In reviewing chart last visits with similar symptoms may be in February of this year.  Given her history of smoking, and some chronic symptoms, in addition with her physical exam is suspect that she may have a upper respiratory infection is likely viral in nature probably has some recurrent bronchitis related to history of smoking.  Not currently having any wheeze rales or rhonchi on exam however given reported history with recurrence and chronic nature will obtain a chest x-ray to rule out pneumonia.   Start tx with OTC meds for sx, supportive tx for likely viral illness, for possible early/recurrent bronchitis tx with steroid burst and mucinex.  Pt to notify us if she develops any wheeze/SOB. Advised smoking cessation.    CXR negative for cardiopulmonary disease.    Danelle Berry, PA-C 09/09/17 11:13 AM

## 2017-09-15 ENCOUNTER — Ambulatory Visit
Admission: RE | Admit: 2017-09-15 | Discharge: 2017-09-15 | Disposition: A | Discharge status: Home / Self Care | Payer: BLUE CROSS/BLUE SHIELD | Source: Ambulatory Visit | Attending: Physician Assistant | Admitting: Physician Assistant

## 2017-09-15 DIAGNOSIS — Z1231 Encounter for screening mammogram for malignant neoplasm of breast: Secondary | ICD-10-CM

## 2017-09-16 ENCOUNTER — Telehealth: Payer: Self-pay

## 2017-09-16 NOTE — Telephone Encounter (Signed)
Patient was in the office on 09/09/2017 and diagnosed with Upper respiratory tract infection. Patient states she is almost done with her medications that were prescribed and her symptoms have not improved,  Patient states she is still coughing,still congested and has green mucus.   Patient was advised to try Mucinex to help with congestion and cough drops to help with her cough. Patient was also advised to take a steam shower and follow up if symptoms continue to worsen or go to an Urgent care. Patient verbalizes understanding

## 2017-09-20 ENCOUNTER — Other Ambulatory Visit: Payer: Self-pay | Admitting: *Deleted

## 2017-09-20 DIAGNOSIS — J329 Chronic sinusitis, unspecified: Secondary | ICD-10-CM

## 2017-09-20 DIAGNOSIS — J40 Bronchitis, not specified as acute or chronic: Principal | ICD-10-CM

## 2017-09-20 NOTE — Telephone Encounter (Signed)
Received fax requesting refill on Prednisone taper and Tessalon.   Ok to refill?

## 2017-09-21 NOTE — Telephone Encounter (Signed)
No she has had recurrent URI/bronchitis several times this year and had negative CXR.  Viral illness/bronchitis usually takes several weeks to improve, so if improving at all she should rely on OTC meds (mucinex, robitussin, etc). and her immune system.  If worsening she would need to be rechecked before prescribing steroids or prescription cough medicine.  Best to see her PCP since she has had this problem in the past multiple times and that hx is known to [facility]

## 2017-11-02 ENCOUNTER — Other Ambulatory Visit: Payer: Self-pay | Admitting: Physician Assistant

## 2017-11-02 DIAGNOSIS — F419 Anxiety disorder, unspecified: Secondary | ICD-10-CM

## 2017-11-02 DIAGNOSIS — G4700 Insomnia, unspecified: Secondary | ICD-10-CM

## 2017-11-02 NOTE — Telephone Encounter (Signed)
Last OV 02/17/2017 Last refill 08/04/2017 Ok to refill?

## 2017-11-17 ENCOUNTER — Encounter: Payer: Self-pay | Admitting: Gynecology

## 2017-11-17 ENCOUNTER — Ambulatory Visit: Payer: BLUE CROSS/BLUE SHIELD | Admitting: Gynecology

## 2017-11-17 VITALS — BP 118/76 | Ht 65.0 in | Wt 145.0 lb

## 2017-11-17 DIAGNOSIS — Z79890 Hormone replacement therapy (postmenopausal): Secondary | ICD-10-CM | POA: Diagnosis not present

## 2017-11-17 DIAGNOSIS — N951 Menopausal and female climacteric states: Secondary | ICD-10-CM | POA: Diagnosis not present

## 2017-11-17 MED ORDER — PROGESTERONE MICRONIZED 100 MG PO CAPS: 100.0000 mg | ORAL_CAPSULE | Freq: Every day | ORAL | 12 refills | Status: DC

## 2017-11-17 MED ORDER — ESTRADIOL 0.05 MG/24HR TD PTTW: 1.0000 | MEDICATED_PATCH | TRANSDERMAL | 12 refills | Status: DC

## 2017-11-17 NOTE — Patient Instructions (Signed)
Start on the estrogen patches twice weekly and the progesterone pill at bedtime.  Call in 1 month if you are continuing to have significant symptoms.  Call if you have any vaginal bleeding.

## 2017-11-17 NOTE — Progress Notes (Signed)
    Hanna Ra 1965/01/25 [id number]        52 y.o.  G2P0011 new patient to me for discussion of menopausal symptoms.  Last menstrual period over a year ago.  No bleeding since.  Having hot flushes, night sweats, sleep disturbances, difficulty concentrating, fatigue and vaginal dryness.  Had GYN exam this year by her primary physician to include a normal Pap smear/negative HPV.  Mammogram 08/2017, colonoscopy 2018.  History of breast cancer in her mother.  Currently smokes cigarettes.  History of abnormal lipid profile.  No hypertension history or diabetes.  Past medical history,surgical history, problem list, medications, allergies, family history and social history were all reviewed and documented as reviewed in the EPIC chart.  ROS:  Performed with pertinent positives and negatives included in the history, assessment and plan.   Additional significant findings : None   Exam: Kennon Portela assistant Vitals:   11/17/17 0854  BP: 118/76  Weight: 145 lb (65.8 kg)  Height: 5\' 5"  (1.651 m)   Body mass index is 24.13 kg/m.  General appearance:  Normal affect, orientation and appearance. Skin: Grossly normal HEENT: Without gross lesions.  No cervical or supraclavicular adenopathy. Thyroid normal.  Lungs:  Clear without wheezing, rales or rhonchi Cardiac: RR, without RMG Abdominal:  Soft, nontender, without masses, guarding, rebound, organomegaly or hernia Breasts:  Examined lying and sitting without masses, retractions, discharge or axillary adenopathy.  Bilateral implants noted Pelvic:  Ext, BUS, Vagina: Normal with mild atrophic changes  Cervix: Mild atrophic changes  Uterus: Anteverted, normal size, shape and contour, midline and mobile nontender   Adnexa: Without masses or tenderness    Anus and perineum: Normal   Rectovaginal: Normal sphincter tone without palpated masses or tenderness.    Assessment/Plan:  52 y.o. G43P0011 female with menopausal symptoms as outlined above.  We  reviewed the various options for management to include expectant, OTC products, pharmacologic nonhormonal such as Effexor and related medications and HRT.  The pros and cons of each choice discussed.  The risks and benefits also reviewed.  Has a history of antidepressant use in the past and had a euphoric type response while taking.  We discussed HRT risks to include increased risk of thrombosis such as stroke heart attack DVT.  Her cigarette smoking and abnormal cholesterol may contribute to this was discussed.  Increased risk of breast cancer associated with HRT studies also reviewed noting her mother's history of breast cancer.  Benefits to include symptom relief and possible cardiovascular and bone health long-term started early reviewed.  Various routes of administration to include oral, transdermal, transvaginal and injections discussed.  After lengthy discussion the patient strongly desires to start on HRT.  We will start with a transdermal estrogen to avoid first-pass effect.  Estradiol 0.5 mg prescribed x1 year.  Prometrium 100 mg nightly also prescribed.  We discussed the need for progesterone for endometrial protection.  If she has issues with the patch we will switch her to oral.  If she has inadequate response she will call me in the month and we will adjust her dosage.  I spent a total of 30 face-to-face minutes with the patient, over 50% was spent counseling and coordination of care.    Dara Lords MD, 9:28 AM 11/17/2017

## 2017-12-29 ENCOUNTER — Telehealth: Payer: Self-pay | Admitting: Physician Assistant

## 2017-12-29 DIAGNOSIS — F419 Anxiety disorder, unspecified: Secondary | ICD-10-CM

## 2017-12-29 DIAGNOSIS — G4700 Insomnia, unspecified: Secondary | ICD-10-CM

## 2017-12-29 NOTE — Telephone Encounter (Signed)
Pt would prefer that she get a paper script for this prescription. She lives in [place] but spends most of her days in [place], so instead of struggling to find pharmacy's to fill it because they are out. she would rather have paper script so if they are out she can just take it elsewhere to have it filled.

## 2017-12-29 NOTE — Telephone Encounter (Signed)
Patient calling to state that the all the [facility] is having a back order on her xanax, wants to know what she can do about the situation  [phone]

## 2017-12-29 NOTE — Telephone Encounter (Signed)
MD please advise

## 2017-12-30 MED ORDER — ALPRAZOLAM 0.5 MG PO TABS: ORAL_TABLET | ORAL | 2 refills | Status: DC

## 2017-12-30 NOTE — Telephone Encounter (Signed)
Printed rx is on my desk

## 2017-12-30 NOTE — Telephone Encounter (Signed)
Call placed to patient and patient made aware.  

## 2018-01-17 ENCOUNTER — Ambulatory Visit: Payer: BLUE CROSS/BLUE SHIELD | Admitting: Gynecology

## 2018-01-24 ENCOUNTER — Telehealth: Payer: Self-pay | Admitting: *Deleted

## 2018-01-24 MED ORDER — ESTRADIOL 0.05 MG/24HR TD PTTW: 1.0000 | MEDICATED_PATCH | TRANSDERMAL | 3 refills | Status: DC

## 2018-01-24 MED ORDER — PROGESTERONE MICRONIZED 100 MG PO CAPS: 100.0000 mg | ORAL_CAPSULE | Freq: Every day | ORAL | 2 refills | Status: DC

## 2018-01-24 NOTE — Telephone Encounter (Signed)
Patient called asking if I could send her estradiol patch 0.05 mg and progesterone 100 mg capsules to new pharmacy [facility], refills until annual Oct 2020 sent.

## 2018-01-26 ENCOUNTER — Other Ambulatory Visit: Payer: Self-pay | Admitting: *Deleted

## 2018-01-26 MED ORDER — ALBUTEROL SULFATE HFA 108 (90 BASE) MCG/ACT IN AERS: 2.0000 | INHALATION_SPRAY | Freq: Four times a day (QID) | RESPIRATORY_TRACT | 2 refills | Status: DC | PRN

## 2018-03-29 ENCOUNTER — Other Ambulatory Visit: Payer: Self-pay | Admitting: Family Medicine

## 2018-03-29 DIAGNOSIS — G4700 Insomnia, unspecified: Secondary | ICD-10-CM

## 2018-03-29 DIAGNOSIS — F419 Anxiety disorder, unspecified: Secondary | ICD-10-CM

## 2018-03-29 NOTE — Telephone Encounter (Signed)
Pt is requesting refill on Xanax   LOV: 09/09/17  LRF:   12/30/17  #60/2

## 2018-03-30 MED ORDER — ALPRAZOLAM 0.5 MG PO TABS: ORAL_TABLET | ORAL | 2 refills | Status: DC

## 2018-03-30 NOTE — Addendum Note (Signed)
Addended by: WILLIS, SANDY B on: 03/30/2018 10:41 AM   Modules accepted: Orders  

## 2018-03-30 NOTE — Telephone Encounter (Signed)
resend

## 2018-07-25 ENCOUNTER — Other Ambulatory Visit: Payer: Self-pay | Admitting: Family Medicine

## 2018-07-25 DIAGNOSIS — G4700 Insomnia, unspecified: Secondary | ICD-10-CM

## 2018-07-25 DIAGNOSIS — F419 Anxiety disorder, unspecified: Secondary | ICD-10-CM

## 2018-07-25 MED ORDER — ALPRAZOLAM 0.5 MG PO TABS: ORAL_TABLET | ORAL | 0 refills | Status: DC

## 2018-07-25 NOTE — Telephone Encounter (Signed)
Pt is requesting refill on Xanax   LOV: 09/09/17  LRF:   03/30/18

## 2018-07-26 ENCOUNTER — Other Ambulatory Visit: Payer: Self-pay | Admitting: Family Medicine

## 2018-07-26 DIAGNOSIS — F419 Anxiety disorder, unspecified: Secondary | ICD-10-CM

## 2018-07-26 DIAGNOSIS — G4700 Insomnia, unspecified: Secondary | ICD-10-CM

## 2018-07-26 NOTE — Telephone Encounter (Signed)
Please change pharmacy to [facility] [place]   Please resend xanax to [facility].

## 2018-07-27 MED ORDER — ALPRAZOLAM 0.5 MG PO TABS: ORAL_TABLET | ORAL | 0 refills | Status: DC

## 2018-07-27 NOTE — Telephone Encounter (Signed)
Can you resend to different pharmacy. I called [facility] and they will shred the rx as she has not picked it up.

## 2018-07-28 ENCOUNTER — Telehealth: Payer: Self-pay | Admitting: Family Medicine

## 2018-07-28 NOTE — Telephone Encounter (Signed)
Patient asking for refill on xanax  [facility]

## 2018-08-01 NOTE — Telephone Encounter (Signed)
This was sent to [facility] as per her request. This is the 3rd request for different pham. [facility] at [facility].

## 2018-08-08 ENCOUNTER — Other Ambulatory Visit: Payer: Self-pay | Admitting: Family Medicine

## 2018-08-08 DIAGNOSIS — Z1231 Encounter for screening mammogram for malignant neoplasm of breast: Secondary | ICD-10-CM

## 2018-08-16 ENCOUNTER — Other Ambulatory Visit: Payer: Self-pay

## 2018-08-17 ENCOUNTER — Encounter: Payer: Self-pay | Admitting: Family Medicine

## 2018-08-17 ENCOUNTER — Ambulatory Visit (INDEPENDENT_AMBULATORY_CARE_PROVIDER_SITE_OTHER): Payer: 59 | Admitting: Family Medicine

## 2018-08-17 VITALS — BP 128/78 | HR 102 | Temp 98.5°F | Resp 16 | Ht 65.0 in | Wt 159.0 lb

## 2018-08-17 DIAGNOSIS — F411 Generalized anxiety disorder: Secondary | ICD-10-CM | POA: Diagnosis not present

## 2018-08-17 DIAGNOSIS — G4700 Insomnia, unspecified: Secondary | ICD-10-CM | POA: Diagnosis not present

## 2018-08-17 DIAGNOSIS — E785 Hyperlipidemia, unspecified: Secondary | ICD-10-CM

## 2018-08-17 DIAGNOSIS — M542 Cervicalgia: Secondary | ICD-10-CM | POA: Diagnosis not present

## 2018-08-17 DIAGNOSIS — G894 Chronic pain syndrome: Secondary | ICD-10-CM

## 2018-08-17 DIAGNOSIS — F419 Anxiety disorder, unspecified: Secondary | ICD-10-CM

## 2018-08-17 MED ORDER — ALPRAZOLAM 0.5 MG PO TABS: ORAL_TABLET | ORAL | 2 refills | Status: DC
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
## 2018-08-22 ENCOUNTER — Other Ambulatory Visit: Payer: 59

## 2018-08-22 ENCOUNTER — Other Ambulatory Visit: Payer: Self-pay

## 2018-08-22 DIAGNOSIS — E785 Hyperlipidemia, unspecified: Secondary | ICD-10-CM

## 2018-08-22 NOTE — Progress Notes (Unsigned)
l °

## 2018-08-23 LAB — COMPLETE METABOLIC PANEL WITH GFR
AG Ratio: 1.8 (calc) (ref 1.0–2.5)
ALT: 15 U/L (ref 6–29)
AST: 17 U/L (ref 10–35)
Albumin: 4.4 g/dL (ref 3.6–5.1)
Alkaline phosphatase (APISO): 49 U/L (ref 37–153)
BUN: 10 mg/dL (ref 7–25)
CO2: 28 mmol/L (ref 20–32)
Calcium: 9.4 mg/dL (ref 8.6–10.4)
Chloride: 100 mmol/L (ref 98–110)
Creat: 0.59 mg/dL (ref 0.50–1.05)
GFR, Est African American: 123 mL/min/{1.73_m2} (ref >=60)
GFR, Est Non African American: 106 mL/min/{1.73_m2} (ref >=60)
Globulin: 2.4 g/dL (calc) (ref 1.9–3.7)
Glucose, Bld: 98 mg/dL (ref 65–99)
Potassium: 4.7 mmol/L (ref 3.5–5.3)
Sodium: 137 mmol/L (ref 135–146)
Total Bilirubin: 0.3 mg/dL (ref 0.2–1.2)
Total Protein: 6.8 g/dL (ref 6.1–8.1)

## 2018-08-23 LAB — LIPID PANEL
Cholesterol: 337 mg/dL — ABNORMAL HIGH (ref <200)
HDL: 46 mg/dL — ABNORMAL LOW (ref >=50)
Non-HDL Cholesterol (Calc): 291 mg/dL (calc) — ABNORMAL HIGH (ref <130)
Total CHOL/HDL Ratio: 7.3 (calc) — ABNORMAL HIGH (ref <5.0)
Triglycerides: 901 mg/dL — ABNORMAL HIGH (ref <150)

## 2018-08-24 ENCOUNTER — Encounter: Payer: Self-pay | Admitting: Family Medicine

## 2018-08-24 ENCOUNTER — Other Ambulatory Visit: Payer: Self-pay

## 2018-08-24 DIAGNOSIS — E781 Pure hyperglyceridemia: Secondary | ICD-10-CM | POA: Insufficient documentation

## 2018-08-24 DIAGNOSIS — G4700 Insomnia, unspecified: Secondary | ICD-10-CM

## 2018-08-24 DIAGNOSIS — F419 Anxiety disorder, unspecified: Secondary | ICD-10-CM

## 2018-08-24 MED ORDER — FENOFIBRATE 160 MG PO TABS: 160.0000 mg | ORAL_TABLET | Freq: Every day | ORAL | 0 refills | Status: DC

## 2018-08-24 MED ORDER — ALPRAZOLAM 0.5 MG PO TABS: ORAL_TABLET | ORAL | 2 refills | Status: DC

## 2018-08-24 NOTE — Telephone Encounter (Signed)
Requested Prescriptions   Pending Prescriptions Disp Refills  . ALPRAZolam (XANAX) 0.5 MG tablet 60 tablet 2    Sig: TAKE 1 TABLET BY MOUTH TWICE A DAY AS NEEDED FOR ANXIETY   Signed Prescriptions Disp Refills  . fenofibrate 160 MG tablet 90 tablet 0    Sig: Take 1 tablet (160 mg total) by mouth daily.    Authorizing Provider: Susy Frizzle    Ordering User: Vanice Sarah    Last OV 08/17/2018  Last written 08/17/2018

## 2018-08-25 ENCOUNTER — Other Ambulatory Visit: Payer: 59

## 2018-09-26 ENCOUNTER — Other Ambulatory Visit: Payer: Self-pay

## 2018-09-26 MED ORDER — FENOFIBRATE 160 MG PO TABS: 160.0000 mg | ORAL_TABLET | Freq: Every day | ORAL | 0 refills | Status: DC

## 2018-09-28 ENCOUNTER — Ambulatory Visit
Admission: RE | Admit: 2018-09-28 | Discharge: 2018-09-28 | Disposition: A | Discharge status: Home / Self Care | Payer: 59 | Source: Ambulatory Visit | Attending: Family Medicine | Admitting: Family Medicine

## 2018-09-28 ENCOUNTER — Other Ambulatory Visit: Payer: Self-pay

## 2018-09-28 DIAGNOSIS — Z1231 Encounter for screening mammogram for malignant neoplasm of breast: Secondary | ICD-10-CM

## 2018-10-11 ENCOUNTER — Encounter: Payer: Self-pay | Admitting: Gynecology

## 2018-11-20 ENCOUNTER — Other Ambulatory Visit: Payer: Self-pay | Admitting: Family Medicine

## 2018-11-20 DIAGNOSIS — G4700 Insomnia, unspecified: Secondary | ICD-10-CM

## 2018-11-20 DIAGNOSIS — F419 Anxiety disorder, unspecified: Secondary | ICD-10-CM

## 2018-11-20 NOTE — Telephone Encounter (Signed)
Requested Prescriptions   Pending Prescriptions Disp Refills  . ALPRAZolam (XANAX) 0.5 MG tablet [Pharmacy Med Name: ALPRAZOLAM 0.5 MG TABLET] 60 tablet 2    Sig: TAKE 1 TABLET BY MOUTH TWICE A DAY AS NEEDED FOR ANXIETY    Last OV 08/17/2018  Last written 08/24/2018

## 2018-11-24 ENCOUNTER — Other Ambulatory Visit: Payer: 59

## 2018-11-24 DIAGNOSIS — E785 Hyperlipidemia, unspecified: Secondary | ICD-10-CM

## 2018-11-25 LAB — LIPID PANEL
Cholesterol: 258 mg/dL — ABNORMAL HIGH (ref <200)
HDL: 65 mg/dL (ref >=50)
LDL Cholesterol (Calc): 156 mg/dL (calc) — ABNORMAL HIGH
Non-HDL Cholesterol (Calc): 193 mg/dL (calc) — ABNORMAL HIGH (ref <130)
Total CHOL/HDL Ratio: 4 (calc) (ref <5.0)
Triglycerides: 207 mg/dL — ABNORMAL HIGH (ref <150)

## 2018-12-07 ENCOUNTER — Other Ambulatory Visit: Payer: Self-pay

## 2018-12-08 ENCOUNTER — Ambulatory Visit (INDEPENDENT_AMBULATORY_CARE_PROVIDER_SITE_OTHER): Payer: 59 | Admitting: Family Medicine

## 2018-12-08 ENCOUNTER — Encounter: Payer: Self-pay | Admitting: Family Medicine

## 2018-12-08 VITALS — BP 110/74 | HR 100 | Temp 97.4°F | Resp 14 | Ht 65.0 in | Wt 156.0 lb

## 2018-12-08 DIAGNOSIS — E785 Hyperlipidemia, unspecified: Secondary | ICD-10-CM

## 2018-12-08 DIAGNOSIS — F411 Generalized anxiety disorder: Secondary | ICD-10-CM | POA: Diagnosis not present

## 2018-12-08 DIAGNOSIS — F17200 Nicotine dependence, unspecified, uncomplicated: Secondary | ICD-10-CM | POA: Diagnosis not present

## 2018-12-08 MED ORDER — ARIPIPRAZOLE 2 MG PO TABS: 2.0000 mg | ORAL_TABLET | Freq: Every day | ORAL | 1 refills | Status: DC
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
## 2018-12-13 ENCOUNTER — Telehealth: Payer: Self-pay

## 2018-12-13 NOTE — Telephone Encounter (Signed)
Medication Samples have been provided to the patient.  Drug name: Vraylar       Strength:  1.5 mg       Qty: 4  LOT: [id number]   Exp.Date: 08/2020  Dosing instructions:Take 1 cap (1.5 mg) by mouth daily.  The patient has been instructed regarding the correct time, dose, and frequency of taking this medication, including desired effects and most common side effects.   Mary Watson 10:21 AM 12/13/2018

## 2018-12-30 ENCOUNTER — Other Ambulatory Visit: Payer: Self-pay | Admitting: Family Medicine

## 2019-01-26 ENCOUNTER — Other Ambulatory Visit: Payer: Self-pay | Admitting: Family Medicine

## 2019-02-14 ENCOUNTER — Other Ambulatory Visit: Payer: Self-pay | Admitting: Family Medicine

## 2019-02-14 DIAGNOSIS — G4700 Insomnia, unspecified: Secondary | ICD-10-CM

## 2019-02-14 DIAGNOSIS — F419 Anxiety disorder, unspecified: Secondary | ICD-10-CM

## 2019-02-14 NOTE — Telephone Encounter (Signed)
Ok to refill??  Last office visit 12/08/2018.  Last refill 12/18/2018, #2 refills.

## 2019-02-15 ENCOUNTER — Telehealth: Payer: Self-pay | Admitting: Family Medicine

## 2019-02-15 NOTE — Telephone Encounter (Signed)
Patient calling to get refill on her xanax [facility] [place]

## 2019-02-15 NOTE — Telephone Encounter (Signed)
Med was sent in this morning

## 2019-02-22 ENCOUNTER — Other Ambulatory Visit: Payer: Self-pay | Admitting: Family Medicine

## 2019-05-09 ENCOUNTER — Other Ambulatory Visit: Payer: Self-pay | Admitting: Family Medicine

## 2019-05-09 DIAGNOSIS — G4700 Insomnia, unspecified: Secondary | ICD-10-CM

## 2019-05-09 DIAGNOSIS — F419 Anxiety disorder, unspecified: Secondary | ICD-10-CM

## 2019-05-09 NOTE — Telephone Encounter (Signed)
Pt is requesting refill on Xanax   LOV: 02/15/2019  LRF:  02/15/2019

## 2019-05-10 MED ORDER — ALPRAZOLAM 0.5 MG PO TABS: 0.5000 mg | ORAL_TABLET | Freq: Two times a day (BID) | ORAL | 2 refills | Status: DC | PRN

## 2019-05-12 ENCOUNTER — Other Ambulatory Visit: Payer: Self-pay | Admitting: Family Medicine

## 2019-08-06 ENCOUNTER — Other Ambulatory Visit: Payer: Self-pay | Admitting: Family Medicine

## 2019-08-06 ENCOUNTER — Other Ambulatory Visit: Payer: Self-pay

## 2019-08-06 DIAGNOSIS — F419 Anxiety disorder, unspecified: Secondary | ICD-10-CM

## 2019-08-06 DIAGNOSIS — G4700 Insomnia, unspecified: Secondary | ICD-10-CM

## 2019-08-06 NOTE — Telephone Encounter (Signed)
Last refill  °05/10/19 °

## 2019-08-07 MED ORDER — ALPRAZOLAM 0.5 MG PO TABS: 0.5000 mg | ORAL_TABLET | Freq: Two times a day (BID) | ORAL | 2 refills | Status: DC | PRN

## 2019-08-08 ENCOUNTER — Other Ambulatory Visit: Payer: Self-pay | Admitting: *Deleted

## 2019-08-08 MED ORDER — FENOFIBRATE 160 MG PO TABS: 160.0000 mg | ORAL_TABLET | Freq: Every day | ORAL | 2 refills | Status: DC

## 2019-08-16 ENCOUNTER — Other Ambulatory Visit: Payer: Self-pay | Admitting: *Deleted

## 2019-08-20 ENCOUNTER — Other Ambulatory Visit: Payer: Self-pay | Admitting: Family Medicine

## 2019-08-20 DIAGNOSIS — Z1231 Encounter for screening mammogram for malignant neoplasm of breast: Secondary | ICD-10-CM

## 2019-08-21 ENCOUNTER — Other Ambulatory Visit: Payer: Self-pay

## 2019-08-21 MED ORDER — FENOFIBRATE 160 MG PO TABS: 160.0000 mg | ORAL_TABLET | Freq: Every day | ORAL | 2 refills | Status: DC

## 2019-08-22 ENCOUNTER — Other Ambulatory Visit: Payer: Self-pay

## 2019-09-27 ENCOUNTER — Other Ambulatory Visit: Payer: Self-pay

## 2019-09-27 ENCOUNTER — Ambulatory Visit (INDEPENDENT_AMBULATORY_CARE_PROVIDER_SITE_OTHER): Payer: 59 | Admitting: Nurse Practitioner

## 2019-09-27 ENCOUNTER — Telehealth: Payer: Self-pay | Admitting: Nurse Practitioner

## 2019-09-27 ENCOUNTER — Other Ambulatory Visit: Payer: Self-pay | Admitting: Nurse Practitioner

## 2019-09-27 VITALS — BP 130/82 | HR 80 | Temp 97.7°F | Resp 18 | Wt 163.2 lb

## 2019-09-27 DIAGNOSIS — R399 Unspecified symptoms and signs involving the genitourinary system: Secondary | ICD-10-CM

## 2019-09-27 DIAGNOSIS — N3000 Acute cystitis without hematuria: Secondary | ICD-10-CM

## 2019-09-27 LAB — URINALYSIS, ROUTINE W REFLEX MICROSCOPIC
Bilirubin Urine: NEGATIVE
Hyaline Cast: NONE SEEN /LPF
Specific Gravity, Urine: 1.006 (ref 1.001–1.03)
WBC, UA: >=60 /HPF — AB (ref 0–5)

## 2019-09-27 LAB — MICROSCOPIC MESSAGE

## 2019-09-27 MED ORDER — NITROFURANTOIN MONOHYD MACRO 100 MG PO CAPS: 100.0000 mg | ORAL_CAPSULE | Freq: Two times a day (BID) | ORAL | 0 refills | Status: AC

## 2019-09-27 MED ORDER — NITROFURANTOIN MONOHYD MACRO 100 MG PO CAPS: 100.0000 mg | ORAL_CAPSULE | Freq: Two times a day (BID) | ORAL | 0 refills | Status: DC

## 2019-09-27 NOTE — Progress Notes (Signed)
Established Patient Office Visit  Subjective:  Patient ID: Mary Watson, female    DOB: 1965/04/13  Age: 54 y.o. MRN: [id number]  CC: pelvic pressure I think I have uti  HPI Mary Watson Pt is a 54 year old female presenting for UTI sxs of pelvic pressure, urine urgency, urine frequency, dark, cloudy urine started yesterday morning. Started taking AZO that decreased sxs but today worse. No fever, chills, CVA pain, n, v, d. No other txs tried. Could not be pregnant. Reports allergic to Cipro.  Past Medical History:  Diagnosis Date  . Arthritis   . Elevated cholesterol   . Hypertriglyceridemia   . Neck pain     Past Surgical History:  Procedure Laterality Date  . AUGMENTATION MAMMAPLASTY    . BREAST SURGERY  1996   augmentation  . CERVICAL DISCECTOMY  11/2012   C5-6,C6-7 discectomy fusion and plating  . ENDOMETRIAL ABLATION    . POSTERIOR CERVICAL FUSION/FORAMINOTOMY N/A 07/10/2014   Procedure: Cervical five-six, Cervical six-seven posterior fusion with instrumentation;  Surgeon: Tressie Stalker, MD;  Location: [facility];  Service: Neurosurgery;  Laterality: N/A;  Cervical five-six, Cervical six-seven posterior fusion with instrumentation  . TUBAL LIGATION      Family History  Problem Relation Age of Onset  . Arthritis Mother   . Breast cancer Mother 64  . Hyperlipidemia Father   . Hypertension Father   . Diabetes Father     Social History   Socioeconomic History  . Marital status: Divorced    Spouse name: Not on file  . Number of children: Not on file  . Years of education: Not on file  . Highest education level: Not on file  Occupational History  . Not on file  Tobacco Use  . Smoking status: Current Every Day Smoker    Packs/day: 0.50    Years: 15.00    Pack years: 7.50    Types: Cigarettes  . Smokeless tobacco: Never Used  Vaping Use  . Vaping Use: Never used  Substance and Sexual Activity  . Alcohol use: Yes    Comment: Occas  . Drug use: No  . Sexual  activity: Not Currently    Comment: 1st intercourse 54 yo-More than 5 partners  Other Topics Concern  . Not on file  Social History Narrative  . Not on file   Social Determinants of Health   Financial Resource Strain:   . Difficulty of Paying Living Expenses: Not on file  Food Insecurity:   . Worried About Programme researcher, broadcasting/film/video in the Last Year: Not on file  . Ran Out of Food in the Last Year: Not on file  Transportation Needs:   . Lack of Transportation (Medical): Not on file  . Lack of Transportation (Non-Medical): Not on file  Physical Activity:   . Days of Exercise per Week: Not on file  . Minutes of Exercise per Session: Not on file  Stress:   . Feeling of Stress : Not on file  Social Connections:   . Frequency of Communication with Friends and Family: Not on file  . Frequency of Social Gatherings with Friends and Family: Not on file  . Attends Religious Services: Not on file  . Active Member of Clubs or Organizations: Not on file  . Attends Banker Meetings: Not on file  . Marital Status: Not on file  Intimate Partner Violence:   . Fear of Current or Ex-Partner: Not on file  . Emotionally Abused: Not on  file  . Physically Abused: Not on file  . Sexually Abused: Not on file    Outpatient Medications Prior to Visit  Medication Sig Dispense Refill  . ALPRAZolam (XANAX) 0.5 MG tablet Take 1 tablet (0.5 mg total) by mouth 2 (two) times daily as needed for anxiety. 60 tablet 2  . Buprenorphine HCl-Naloxone HCl 8-2 MG FILM 1/2 film bid    . cariprazine (VRAYLAR) capsule Take by mouth.    . cholecalciferol (VITAMIN D) 1000 units tablet Take 1,000 Units by mouth daily.    . fenofibrate 160 MG tablet Take 1 tablet (160 mg total) by mouth daily. 30 tablet 2  . Multiple Vitamin (MULTIVITAMIN) tablet Take 1 tablet by mouth daily.    Marland Kitchen albuterol (PROVENTIL HFA;VENTOLIN HFA) 108 (90 Base) MCG/ACT inhaler Inhale 2 puffs into the lungs every 6 (six) hours as needed for  wheezing or shortness of breath. 1 Inhaler 2  . estradiol (VIVELLE-DOT) 0.05 MG/24HR patch Place 1 patch (0.05 mg total) onto the skin 2 (two) times a week. 24 patch 3  . GARLIC PO Take by mouth.    . nabumetone (RELAFEN) 750 MG tablet     . ARIPiprazole (ABILIFY) 2 MG tablet TAKE 1 TABLET BY MOUTH EVERY DAY 90 tablet 1  . progesterone (PROMETRIUM) 100 MG capsule Take 1 capsule (100 mg total) by mouth at bedtime. 90 capsule 2   No facility-administered medications prior to visit.    Allergies  Allergen Reactions  . Ciprofloxacin     vomiting    ROS Review of Systems  All other systems reviewed and are negative.     Objective:    Physical Exam Vitals and nursing note reviewed.  Constitutional:      General: She is not in acute distress.    Appearance: Normal appearance. She is well-developed and well-groomed. She is not ill-appearing, toxic-appearing or diaphoretic.  HENT:     Head: Normocephalic and atraumatic.     Right Ear: Hearing normal.     Left Ear: Hearing normal.     Nose: Nose normal.     Mouth/Throat:     Mouth: Mucous membranes are moist.     Pharynx: Oropharynx is clear.  Eyes:     General: Lids are normal. Lids are everted, no foreign bodies appreciated.     Extraocular Movements: Extraocular movements intact.     Conjunctiva/sclera: Conjunctivae normal.     Pupils: Pupils are equal, round, and reactive to light.  Cardiovascular:     Rate and Rhythm: Normal rate and regular rhythm.     Pulses: Normal pulses.     Heart sounds: Normal heart sounds.  Pulmonary:     Effort: Pulmonary effort is normal.     Breath sounds: Normal breath sounds.  Abdominal:     General: Abdomen is flat. Bowel sounds are normal.     Tenderness: There is no guarding or rebound.  Musculoskeletal:        General: Normal range of motion.     Cervical back: Full passive range of motion without pain, normal range of motion and neck supple.     Right lower leg: No edema.     Left  lower leg: No edema.  Skin:    General: Skin is warm and dry.     Coloration: Skin is not cyanotic, jaundiced or pale.     Findings: No rash.  Neurological:     General: No focal deficit present.     Mental Status: She  is alert and oriented to person, place, and time.     GCS: GCS eye subscore is 4. GCS verbal subscore is 5. GCS motor subscore is 6.  Psychiatric:        Attention and Perception: Attention normal.        Mood and Affect: Mood and affect normal.        Speech: Speech normal.        Behavior: Behavior normal. Behavior is cooperative.        Thought Content: Thought content normal.        Judgment: Judgment normal.     BP 130/82 (BP Location: Left Arm, Patient Position: Sitting, Cuff Size: Normal)   Pulse 80   Temp 97.7 F (36.5 C) (Temporal)   Resp 18   Wt 163 lb 3.2 oz (74 kg)   LMP  (LMP Unknown)   SpO2 94%   BMI 27.16 kg/m  Wt Readings from Last 3 Encounters:  09/27/19 163 lb 3.2 oz (74 kg)  12/08/18 156 lb (70.8 kg)  08/17/18 159 lb (72.1 kg)     Health Maintenance Due  Topic Date Due  . Hepatitis C Screening  Never done  . COVID-19 Vaccine (1) Never done  . HIV Screening  Never done  . TETANUS/TDAP  Never done  . INFLUENZA VACCINE  08/19/2019    There are no preventive care reminders to display for this patient.  Lab Results  Component Value Date   TSH 2.19 02/17/2017   Lab Results  Component Value Date   WBC 6.6 02/17/2017   HGB 13.4 02/17/2017   HCT 38.3 02/17/2017   MCV 89.9 02/17/2017   PLT 241 02/17/2017   Lab Results  Component Value Date   NA 137 08/22/2018   K 4.7 08/22/2018   CO2 28 08/22/2018   GLUCOSE 98 08/22/2018   BUN 10 08/22/2018   CREATININE 0.59 08/22/2018   BILITOT 0.3 08/22/2018   AST 17 08/22/2018   ALT 15 08/22/2018   PROT 6.8 08/22/2018   CALCIUM 9.4 08/22/2018   Lab Results  Component Value Date   CHOL 258 (H) 11/24/2018   Lab Results  Component Value Date   HDL 65 11/24/2018   Lab Results    Component Value Date   LDLCALC 156 (H) 11/24/2018   Lab Results  Component Value Date   TRIG 207 (H) 11/24/2018   Lab Results  Component Value Date   CHOLHDL 4.0 11/24/2018   No results found for: HGBA1C    Assessment & Plan:   Problem List Items Addressed This Visit    None    Visit Diagnoses    UTI symptoms    -  Primary   Relevant Orders   Urinalysis, Routine w reflex microscopic (Completed)   Acute cystitis without hematuria       Relevant Medications   nitrofurantoin, macrocrystal-monohydrate, (MACROBID) 100 MG capsule    may continue Azo, drink plenty of water, seek medical attention for new or worsening sxs  Meds ordered this encounter  Medications  . nitrofurantoin, macrocrystal-monohydrate, (MACROBID) 100 MG capsule    Sig: Take 1 capsule (100 mg total) by mouth 2 (two) times daily for 7 days.    Dispense:  14 capsule    Refill:  0    Follow-up: Return if symptoms worsen or fail to improve.    Elmore Guise, FNP

## 2019-09-27 NOTE — Telephone Encounter (Signed)
Left vm on pt's phone of results.

## 2019-09-27 NOTE — Telephone Encounter (Signed)
Call pt she had to leave to go to work and lab was behind, let her know she has uti antibiotic called in

## 2019-10-10 ENCOUNTER — Other Ambulatory Visit: Payer: Self-pay

## 2019-10-10 ENCOUNTER — Ambulatory Visit
Admission: RE | Admit: 2019-10-10 | Discharge: 2019-10-10 | Disposition: A | Discharge status: Home / Self Care | Payer: 59 | Source: Ambulatory Visit | Attending: Family Medicine | Admitting: Family Medicine

## 2019-10-10 DIAGNOSIS — Z1231 Encounter for screening mammogram for malignant neoplasm of breast: Secondary | ICD-10-CM

## 2019-10-29 ENCOUNTER — Other Ambulatory Visit: Payer: Self-pay

## 2019-10-29 DIAGNOSIS — F419 Anxiety disorder, unspecified: Secondary | ICD-10-CM

## 2019-10-29 DIAGNOSIS — G4700 Insomnia, unspecified: Secondary | ICD-10-CM

## 2019-10-29 MED ORDER — ALPRAZOLAM 0.5 MG PO TABS: 0.5000 mg | ORAL_TABLET | Freq: Two times a day (BID) | ORAL | 2 refills | Status: DC | PRN

## 2019-11-17 ENCOUNTER — Other Ambulatory Visit: Payer: Self-pay | Admitting: Family Medicine

## 2020-01-23 ENCOUNTER — Other Ambulatory Visit: Payer: Self-pay | Admitting: Family Medicine

## 2020-01-23 DIAGNOSIS — F419 Anxiety disorder, unspecified: Secondary | ICD-10-CM

## 2020-01-23 DIAGNOSIS — G4700 Insomnia, unspecified: Secondary | ICD-10-CM

## 2020-01-23 NOTE — Telephone Encounter (Signed)
Ok to refill??  Last office visit 09/27/2019.  Last refill 10/29/2019, #2 refills.

## 2020-02-09 ENCOUNTER — Other Ambulatory Visit: Payer: Self-pay | Admitting: Family Medicine

## 2020-04-22 ENCOUNTER — Other Ambulatory Visit: Payer: Self-pay | Admitting: Family Medicine

## 2020-04-22 DIAGNOSIS — F419 Anxiety disorder, unspecified: Secondary | ICD-10-CM

## 2020-04-22 DIAGNOSIS — G4700 Insomnia, unspecified: Secondary | ICD-10-CM

## 2020-04-22 NOTE — Telephone Encounter (Signed)
Pt called in needing a refill of  ALPRAZolam (XANAX) 0.5 MG tablet   [facility] to [facility] [place]  Cb#: [phone]

## 2020-04-22 NOTE — Telephone Encounter (Signed)
Ok to refill??  Last office visit 09/27/2019.  Last refill 01/24/2020, #2 refills.

## 2020-04-23 NOTE — Telephone Encounter (Signed)
Patient only has 1-2 days left of   ALPRAZolam (XANAX) 0.5 MG tablet [[id number]]   Pharmacy:   [id number] [place], [place] - [address]  [address] [place] [address]  Phone:  [phone] Fax:  [phone]  DEA #:  [id number]  Patient requesting refill asap to avoid lapse in doses. Please advise patient when Rx called in at [phone] (work number).

## 2020-04-24 MED ORDER — ALPRAZOLAM 0.5 MG PO TABS: 0.5000 mg | ORAL_TABLET | Freq: Two times a day (BID) | ORAL | 2 refills | Status: DC | PRN

## 2020-07-18 ENCOUNTER — Other Ambulatory Visit: Payer: Self-pay | Admitting: Family Medicine

## 2020-07-18 DIAGNOSIS — F419 Anxiety disorder, unspecified: Secondary | ICD-10-CM

## 2020-07-18 DIAGNOSIS — G4700 Insomnia, unspecified: Secondary | ICD-10-CM

## 2020-07-18 NOTE — Telephone Encounter (Signed)
PDMP reviewed.  Appears patient takes this medication chronically.  Refill given in place of PCP who is out of the office. 

## 2020-08-21 ENCOUNTER — Other Ambulatory Visit: Payer: Self-pay | Admitting: Family Medicine

## 2020-08-21 DIAGNOSIS — G4700 Insomnia, unspecified: Secondary | ICD-10-CM

## 2020-08-21 DIAGNOSIS — F419 Anxiety disorder, unspecified: Secondary | ICD-10-CM
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
## 2020-08-22 ENCOUNTER — Telehealth: Payer: Self-pay | Admitting: Family Medicine

## 2020-08-22 NOTE — Telephone Encounter (Signed)
Medication was sent to Pharmacy on 08/22/2020.

## 2020-08-22 NOTE — Telephone Encounter (Signed)
Patient left a voicemail message to request refill of ALPRAZolam (XANAX) 0.5 MG tablet [[id number]]  Patient completely out of medication and is requesting refill before the weekend.   Pharmacy listed as  [id number] - [place], [place] - [address]  [address] [place] [address]  Phone:  [phone]  Fax:  [phone]  DEA #:  [id number]  Please advise at [phone]

## 2020-08-25 NOTE — Telephone Encounter (Signed)
Appt for pt made with [facility] for anxiety follow up 8/16

## 2020-09-02 ENCOUNTER — Other Ambulatory Visit: Payer: Self-pay

## 2020-09-02 ENCOUNTER — Other Ambulatory Visit: Payer: Self-pay | Admitting: Family Medicine

## 2020-09-02 ENCOUNTER — Ambulatory Visit (INDEPENDENT_AMBULATORY_CARE_PROVIDER_SITE_OTHER): Payer: 59 | Admitting: Family Medicine

## 2020-09-02 ENCOUNTER — Encounter: Payer: Self-pay | Admitting: Family Medicine

## 2020-09-02 ENCOUNTER — Encounter: Payer: Self-pay | Admitting: *Deleted

## 2020-09-02 VITALS — BP 134/82 | HR 84 | Temp 97.9°F | Resp 16 | Ht 65.0 in | Wt 154.0 lb

## 2020-09-02 DIAGNOSIS — F411 Generalized anxiety disorder: Secondary | ICD-10-CM | POA: Diagnosis not present

## 2020-09-02 DIAGNOSIS — Z1231 Encounter for screening mammogram for malignant neoplasm of breast: Secondary | ICD-10-CM

## 2020-09-02 DIAGNOSIS — Z23 Encounter for immunization: Secondary | ICD-10-CM

## 2020-09-02 DIAGNOSIS — F319 Bipolar disorder, unspecified: Secondary | ICD-10-CM | POA: Diagnosis not present

## 2020-09-02 DIAGNOSIS — E785 Hyperlipidemia, unspecified: Secondary | ICD-10-CM | POA: Diagnosis not present

## 2020-09-02 LAB — COMPLETE METABOLIC PANEL WITH GFR
AG Ratio: 2.2 (calc) (ref 1.0–2.5)
ALT: 16 U/L (ref 6–29)
AST: 16 U/L (ref 10–35)
Albumin: 4.7 g/dL (ref 3.6–5.1)
Alkaline phosphatase (APISO): 54 U/L (ref 37–153)
BUN: 14 mg/dL (ref 7–25)
CO2: 26 mmol/L (ref 20–32)
Calcium: 9.4 mg/dL (ref 8.6–10.4)
Chloride: 100 mmol/L (ref 98–110)
Creat: 0.64 mg/dL (ref 0.50–1.03)
Globulin: 2.1 g/dL (calc) (ref 1.9–3.7)
Glucose, Bld: 110 mg/dL — ABNORMAL HIGH (ref 65–99)
Potassium: 4.7 mmol/L (ref 3.5–5.3)
Sodium: 137 mmol/L (ref 135–146)
Total Bilirubin: 0.5 mg/dL (ref 0.2–1.2)
Total Protein: 6.8 g/dL (ref 6.1–8.1)
eGFR: 106 mL/min/{1.73_m2} (ref >=60)

## 2020-09-02 LAB — LIPID PANEL
Cholesterol: 256 mg/dL — ABNORMAL HIGH (ref <200)
HDL: 71 mg/dL (ref >=50)
LDL Cholesterol (Calc): 146 mg/dL (calc) — ABNORMAL HIGH
Non-HDL Cholesterol (Calc): 185 mg/dL (calc) — ABNORMAL HIGH (ref <130)
Total CHOL/HDL Ratio: 3.6 (calc) (ref <5.0)
Triglycerides: 252 mg/dL — ABNORMAL HIGH (ref <150)
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
## 2020-09-04 ENCOUNTER — Encounter: Payer: Self-pay | Admitting: *Deleted

## 2020-09-04 NOTE — Addendum Note (Signed)
Addended by: Phillips Odor on: 09/04/2020 11:34 AM   Modules accepted: Orders

## 2020-09-21 ENCOUNTER — Other Ambulatory Visit: Payer: Self-pay | Admitting: Family Medicine

## 2020-09-21 DIAGNOSIS — F419 Anxiety disorder, unspecified: Secondary | ICD-10-CM

## 2020-09-21 DIAGNOSIS — G4700 Insomnia, unspecified: Secondary | ICD-10-CM

## 2020-09-23 NOTE — Telephone Encounter (Signed)
Patient called in this morning requesting this medication. She states she went without it the holiday weekend.  CB# [phone]

## 2020-09-23 NOTE — Telephone Encounter (Signed)
Ok to refill?? Last office visit 09/02/2020. Last refill 08/22/2020.  Ok to add refills to prescription?  Of note, medication was not due until 09/22/2020. Refill request was not received until 09/21/2020.

## 2020-10-20 ENCOUNTER — Ambulatory Visit: Payer: 59

## 2020-10-27 ENCOUNTER — Other Ambulatory Visit: Payer: Self-pay

## 2020-10-27 ENCOUNTER — Encounter: Payer: Self-pay | Admitting: Family Medicine

## 2020-10-27 ENCOUNTER — Ambulatory Visit (INDEPENDENT_AMBULATORY_CARE_PROVIDER_SITE_OTHER): Payer: 59 | Admitting: Family Medicine

## 2020-10-27 VITALS — BP 144/86 | HR 110 | Temp 98.1°F | Resp 16 | Ht 65.0 in | Wt 147.0 lb

## 2020-10-27 DIAGNOSIS — F319 Bipolar disorder, unspecified: Secondary | ICD-10-CM
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
## 2020-11-11 ENCOUNTER — Telehealth: Payer: Self-pay | Admitting: Family Medicine

## 2020-11-11 NOTE — Telephone Encounter (Signed)
Patient dropped off FMLA paperwork for provider to complete and sign. Patient also paid $20 up front since she lives far away.   Patient requesting paperwork be sent via email to [facility], [email or web address] when completed.  Paperwork placed in hanging folder at [employer].    Please advise t [phone] or [phone] if additional money required.

## 2020-11-12 NOTE — Telephone Encounter (Signed)
Received FMLA forms.   Call placed to patient for more information. LMTRC.  Of note, contacted patient sister as I was unable to contact patient. Patient sister states that patient was having increased anxiety and hallucinations. States that patient work has requested that patient take a leave of absence until she is more controlled with her medications.

## 2020-11-13 NOTE — Telephone Encounter (Signed)
Call placed to patient for more information.   Job title: Data processing manager for [employer]) Duties: account development, recruiting, admin, HR duties, payroll, placing candidates in open positions  Hours of Work: M-F, 8am- 5pm, after hours calls  Reason FMLA requested: delusions and mania- restarted taking Vraylar  Dx: Bipolar 1 Disorder (F31.9), GAD (F41.1)   Requested Beginning Date: 11/10/2020 Return to Work Date: 11/24/2020  Forms routed to provider.    Of note, patient noted to to still be in manic episode as evidenced by rapid speech, tangential speech, and flight of ideas. Unable to fully obtain history as Clinical research associate was unable to follow conversation due to the above listed Sx.   Follow up appointment scheduled for 11/17/2020.

## 2020-11-17 ENCOUNTER — Ambulatory Visit: Payer: 59 | Admitting: Family Medicine

## 2020-11-17 NOTE — Telephone Encounter (Signed)
Patient NS for appointment on 11/17/2020.  Call placed to patient to advise that forms will not be completed until F/U appointment held.   No answer. No VM.

## 2020-11-18 ENCOUNTER — Other Ambulatory Visit: Payer: Self-pay

## 2020-11-18 ENCOUNTER — Ambulatory Visit (INDEPENDENT_AMBULATORY_CARE_PROVIDER_SITE_OTHER): Payer: 59 | Admitting: Family Medicine

## 2020-11-18 DIAGNOSIS — F319 Bipolar disorder, unspecified: Secondary | ICD-10-CM

## 2020-11-18 NOTE — Progress Notes (Signed)
Subjective:    Patient ID: Mary Watson, female    DOB: 23-Dec-1965, 55 y.o.   MRN: [id number]  HPI  10/27/20 Patient has a history of bipolar syndrome.  Apparently she is stopped the Vraylar several weeks ago.  Today the patient is very animated and upset.  She states that she is being stalked.  This is occurring by an unseen individual who has stolen her identity.  She states that he is hacking into all of her phones and correcting them.  She states that he has hacked into her TVs and that he is watching her through her TV.  She states that she could not be without a phone so she went and bought a burner phone.  She states that is since she turned the phone on, he remotely accessed the phone and started hacking it and correcting it and sending it to various websites.  She even states that he is somehow "hacked her car" and causes the electronics in the car not to work.  Initially her story sounded tragic and believable.  However the longer she speaks to me for her story sounds implausible.  She states that she is contacted the police and that no one takes her serious.  She seems upset with me because I continue to try to focus on the medical aspect of her presentation.  Patient has rapid speech that is pressured.  It is difficult to follow her.  She is very animated.  She frequently repeatedly bangs her hand on the table while talking to me in a loud voice.  She states that her nerves are reticulocyte and that she wants additional Xanax to help with her anxiety.  She states that she cannot sleep.  From an objective standpoint, I have a very agitated patient who is not sleeping.  She has very pressured speech and tangential speech.  I explained to the patient that I do not know all the aspects of her story however many aspects seem implausible and almost delusional.  Therefore I feel that the patient is most likely manic.  I explained this to her as politely as I possibly could.  Each time the patient began  shaking her head and rolling her eyes it may and is obviously very upset.  I explained to her that I only want to help her.  I am unable to assist with stolen identity or cyber stalking.  However I can better try to help manage her "nerves".  As result I would focus on managing acute mania.  Therefore I wanted her to resume the Vraylar immediately.  At that time, my plan was:  I believe the patient is dealing with delusions and mania.  I do not believe that the patient is lying.  However I do not believe that everything is happening exactly as she perceives it to be happening.  Therefore I want to focus on managing mania with Vraylar 1.5 mg daily and recheck in 1 week.  I asked the patient to return in 1 week so that we can decide whether to uptitrate the medication.  However she seems very upset and frustrated with me and leaves abruptly  11/18/20 Patient is being seen today as a telephone visit.  She consents to be seen via telephone.  In fact she request to be seen via telephone due to mechanical malfunction in her car preventing her from coming to the office.  She missed her appointment yesterday.  Phone call began at 1218.  Phone call  concluded at 1235.  Patient did not follow-up after her last office visit.  Apparently the situation worsened to the point that her bosses asked her to take a leave of absence.  Her first day off of work was November 10, 2020.  She has taken a 2-week leave of absence and would like to return to work on November 7.  She states that she has been working at her current job for the last 5 years.  She works at a [employer] as a Production designer, theatre/television/film.  She enjoys her job.  However when her boss asked her to take a leave of absence, patient states that it was a realization and she started to resume the Vraylar.  She is currently on 1.5 mg daily.  She states that she is doing much better.  She is now sleeping well.  She denies any "panic attacks".  She states that she is coming to the  realization that she needs to "move on".  She also states that she has "given up telling her story  because she realizes how it sounds to people not in the situation".  She is eating well now.  She denies any racing thoughts.  However, based on our conversation, there is still seems to be a fixed belief that someone is trying to control and ruin her life.  However she has insight now into how that sounds to other people and she is making better decisions as far as acting on those thoughts.  It is hard to tell how much of what she reports actually occurred.  She does have 2 pages of fraudulent charges on her credit card where her identity was stolen.  She has documentation to support this.  Therefore much of what she says did occur.  However I believe that with her mania, she was also "seeing" controlling events from an unnamed individual where they may not have actually occurred as the patient was perceiving them.  I have discussed this earlier in her previous office visit.  She wants to go back to work.  She denies any racing thoughts.  She is no longer demonstrating tangential speech.  Her thoughts seem more coherent. Past Medical History:  Diagnosis Date   Arthritis    Elevated cholesterol    Hypertriglyceridemia    Neck pain    Current Outpatient Medications on File Prior to Visit  Medication Sig Dispense Refill   ALPRAZolam (XANAX) 0.5 MG tablet Take 1 tablet (0.5 mg total) by mouth 2 (two) times daily as needed for anxiety. 60 tablet 3   cariprazine (VRAYLAR) 1.5 MG capsule Take by mouth. (Patient not taking: Reported on 10/27/2020)     cholecalciferol (VITAMIN D) 1000 units tablet Take 1,000 Units by mouth daily. (Patient not taking: Reported on 10/27/2020)     fenofibrate 160 MG tablet TAKE 1 TABLET BY MOUTH EVERY DAY (Patient not taking: Reported on 10/27/2020) 90 tablet 1   Multiple Vitamin (MULTIVITAMIN) tablet Take 1 tablet by mouth daily. (Patient not taking: Reported on 10/27/2020)      No current facility-administered medications on file prior to visit.   Past Surgical History:  Procedure Laterality Date   AUGMENTATION MAMMAPLASTY     BREAST SURGERY  1996   augmentation   CERVICAL DISCECTOMY  11/2012   C5-6,C6-7 discectomy fusion and plating   ENDOMETRIAL ABLATION     POSTERIOR CERVICAL FUSION/FORAMINOTOMY N/A 07/10/2014   Procedure: Cervical five-six, Cervical six-seven posterior fusion with instrumentation;  Surgeon: Tressie Stalker, MD;  Location:  [facility];  Service: Neurosurgery;  Laterality: N/A;  Cervical five-six, Cervical six-seven posterior fusion with instrumentation   TUBAL LIGATION     Social History   Socioeconomic History   Marital status: Divorced    Spouse name: Not on file   Number of children: Not on file   Years of education: Not on file   Highest education level: Not on file  Occupational History   Not on file  Tobacco Use   Smoking status: Every Day    Packs/day: 0.50    Years: 15.00    Pack years: 7.50    Types: Cigarettes   Smokeless tobacco: Never  Vaping Use   Vaping Use: Never used  Substance and Sexual Activity   Alcohol use: Yes    Comment: Occas   Drug use: No   Sexual activity: Not Currently    Comment: 1st intercourse 55 yo-More than 5 partners  Other Topics Concern   Not on file  Social History Narrative   Not on file   Social Determinants of Health   Financial Resource Strain: Not on file  Food Insecurity: Not on file  Transportation Needs: Not on file  Physical Activity: Not on file  Stress: Not on file  Social Connections: Not on file  Intimate Partner Violence: Not on file     Review of Systems  All other systems reviewed and are negative.     Objective:   Physical Exam Constitutional:      Appearance: She is not ill-appearing.  Psychiatric:        Attention and Perception: Attention normal.        Mood and Affect: Mood and affect normal. Affect is not angry.        Speech: Speech normal.  Speech is not rapid and pressured.        Behavior: Behavior normal. Behavior is not agitated.        Cognition and Memory: Cognition and memory normal.        Judgment: Judgment normal.      Bipolar 1 disorder (HCC) I am optimistic that the patient is doing better.  I am still concerned that she reports that an individual was trying to control her life.  Instead of no longer having these thoughts, she has now got to the point where she has given up trying to convince other people that it occurred.  It is difficult to know exactly what happened and what did not happen.  Someone did still her identity and make fraudulent charges.  There is documentation of this.  However I do not feel that outside person was controlling her TV and her phones in her vehicle.  I think that this is impractical.  Patient does not deny that that occurred.  However I do feel that she is doing much better.  For these reasons I recommended trying to increase the Vraylar to 3 mg just to see how she will do.  I do not feel that she is a danger to go back to work to herself or to anyone else at this point.  Therefore I will write her to go back to work but I would like to check in with her via telephone in 1 week to see how she is doing on the higher dose.

## 2020-11-19 NOTE — Telephone Encounter (Signed)
Patient NS for appointment on 11/17/2020. Re-scheduled for telephone visit on 11/18/2020.  Received completed FMLA from provider. E-mailed to [employer].   Originals mailed to patient.

## 2020-12-10 ENCOUNTER — Ambulatory Visit (INDEPENDENT_AMBULATORY_CARE_PROVIDER_SITE_OTHER): Payer: 59 | Admitting: Nurse Practitioner

## 2020-12-10 ENCOUNTER — Encounter: Payer: Self-pay | Admitting: Nurse Practitioner

## 2020-12-10 ENCOUNTER — Other Ambulatory Visit: Payer: Self-pay

## 2020-12-10 VITALS — BP 122/78 | HR 95 | Temp 97.3°F | Ht 65.0 in | Wt 148.8 lb

## 2020-12-10 DIAGNOSIS — N644 Mastodynia: Secondary | ICD-10-CM | POA: Diagnosis not present

## 2020-12-10 DIAGNOSIS — Z9882 Breast implant status: Secondary | ICD-10-CM

## 2020-12-10 NOTE — Progress Notes (Signed)
Subjective:    Patient ID: Mary Watson, female    DOB: 1965/09/28, 55 y.o.   MRN: [id number]  HPI: Mary Watson is a 55 y.o. female presenting for right breast pain.  Chief Complaint  Patient presents with   Follow-up    R side breast pain x1 week heard pop does have implants    BREAST PAIN Patient reports she was reaching for something about 1 week ago and heard a loud pop sound that came from her right breast.  She has a history of breast augmentation in her 6s.  Since this time, she has felt pain in her right breast.  She is also interested in having her breast implants removed. Duration :days Location: right Onset: sudden Severity: severe Quality: sharp Frequency: constant Redness: no Swelling: no Trauma: no trauma Breastfeeding: no Associated with menstral cycle: no Nipple discharge: no Breast lump: no Status: stable Treatments attempted: ibuprofen  Previous mammogram: yes  Allergies  Allergen Reactions   Ciprofloxacin     vomiting    Outpatient Encounter Medications as of 12/10/2020  Medication Sig   ALPRAZolam (XANAX) 0.5 MG tablet Take 1 tablet (0.5 mg total) by mouth 2 (two) times daily as needed for anxiety.   cariprazine (VRAYLAR) 1.5 MG capsule Take by mouth.   cholecalciferol (VITAMIN D) 1000 units tablet Take 1,000 Units by mouth daily.   fenofibrate 160 MG tablet TAKE 1 TABLET BY MOUTH EVERY DAY   Multiple Vitamin (MULTIVITAMIN) tablet Take 1 tablet by mouth daily.   No facility-administered encounter medications on file as of 12/10/2020.    Patient Active Problem List   Diagnosis Date Noted   Hypertriglyceridemia    Smoker 02/17/2017   Insomnia 11/22/2016   Generalized anxiety disorder 11/22/2016   Cervical pseudoarthrosis (HCC) 07/10/2014   Chronic pain syndrome 06/19/2013   Chronic narcotic use 06/19/2013   Cervicalgia 06/19/2013   Cervical radiculitis 06/19/2013   Neuropathic pain 06/19/2013   Myofascial pain 06/19/2013   Chronic pain  associated with significant psychosocial dysfunction 06/19/2013   Neck pain     Past Medical History:  Diagnosis Date   Arthritis    Elevated cholesterol    Hypertriglyceridemia    Neck pain     Relevant past medical, surgical, family and social history reviewed and updated as indicated. Interim medical history since our last visit reviewed.  Review of Systems Per HPI unless specifically indicated above     Objective:    BP 122/78   Pulse 95   Temp (!) 97.3 F (36.3 C)   Ht 5\' 5"  (1.651 m)   Wt 148 lb 12.8 oz (67.5 kg)   LMP  (LMP Unknown)   SpO2 96%   BMI 24.76 kg/m   Wt Readings from Last 3 Encounters:  12/10/20 148 lb 12.8 oz (67.5 kg)  10/27/20 147 lb (66.7 kg)  09/02/20 154 lb (69.9 kg)    Physical Exam Vitals and nursing note reviewed. Exam conducted with a chaperone present [facility]).  Constitutional:      General: She is not in acute distress.    Appearance: Normal appearance. She is not toxic-appearing.  Chest:     Chest wall: Tenderness present. No deformity or swelling.  Breasts:    Breasts are symmetrical.     Right: Tenderness present. No inverted nipple, mass, nipple discharge or skin change.     Left: Normal. No inverted nipple, mass, nipple discharge or skin change.       Comments: Tenderness to palpation  in the area marked  Skin:    General: Skin is warm and dry.     Coloration: Skin is not jaundiced or pale.     Findings: No erythema.  Neurological:     Mental Status: She is alert and oriented to person, place, and time.       Assessment & Plan:  1. Breast pain Acute.  Will obtain diagnostic mammogram to evaluate the breast tissue given the area of tenderness.    - MM DIAG BREAST W/IMPLANT TOMO BILATERAL; Future  2. Breast implant status Referral placed to plastic surgery per patient request.  She is interested in having the implants removed.  - Ambulatory referral to Plastic Surgery     Follow up plan: Return if symptoms worsen or  fail to improve.

## 2020-12-15 ENCOUNTER — Other Ambulatory Visit: Payer: Self-pay | Admitting: Nurse Practitioner

## 2020-12-15 DIAGNOSIS — N644 Mastodynia: Secondary | ICD-10-CM

## 2020-12-18 ENCOUNTER — Other Ambulatory Visit: Payer: Self-pay

## 2020-12-18 ENCOUNTER — Ambulatory Visit
Admission: RE | Admit: 2020-12-18 | Discharge: 2020-12-18 | Disposition: A | Discharge status: Home / Self Care | Payer: 59 | Source: Ambulatory Visit | Attending: Nurse Practitioner | Admitting: Nurse Practitioner

## 2020-12-18 DIAGNOSIS — N644 Mastodynia: Secondary | ICD-10-CM

## 2020-12-19 ENCOUNTER — Institutional Professional Consult (permissible substitution): Payer: 59 | Admitting: Plastic Surgery

## 2020-12-26 ENCOUNTER — Encounter: Payer: Self-pay | Admitting: Family Medicine

## 2020-12-26 ENCOUNTER — Other Ambulatory Visit: Payer: Self-pay

## 2020-12-26 ENCOUNTER — Ambulatory Visit (INDEPENDENT_AMBULATORY_CARE_PROVIDER_SITE_OTHER): Payer: 59 | Admitting: Family Medicine

## 2020-12-26 VITALS — BP 144/82 | HR 113 | Temp 97.8°F | Ht 65.0 in | Wt 144.2 lb

## 2020-12-26 DIAGNOSIS — F319 Bipolar disorder, unspecified: Secondary | ICD-10-CM | POA: Diagnosis not present

## 2020-12-26 MED ORDER — ONDANSETRON HCL 4 MG PO TABS: 4.0000 mg | ORAL_TABLET | Freq: Three times a day (TID) | ORAL | 0 refills | Status: DC | PRN

## 2020-12-26 NOTE — Progress Notes (Signed)
Subjective:    Patient ID: Mary Watson, female    DOB: 23-Dec-1965, 55 y.o.   MRN: [id number]  HPI  10/27/20 Patient has a history of bipolar syndrome.  Apparently she is stopped the Vraylar several weeks ago.  Today the patient is very animated and upset.  She states that she is being stalked.  This is occurring by an unseen individual who has stolen her identity.  She states that he is hacking into all of her phones and correcting them.  She states that he has hacked into her TVs and that he is watching her through her TV.  She states that she could not be without a phone so she went and bought a burner phone.  She states that is since she turned the phone on, he remotely accessed the phone and started hacking it and correcting it and sending it to various websites.  She even states that he is somehow "hacked her car" and causes the electronics in the car not to work.  Initially her story sounded tragic and believable.  However the longer she speaks to me for her story sounds implausible.  She states that she is contacted the police and that no one takes her serious.  She seems upset with me because I continue to try to focus on the medical aspect of her presentation.  Patient has rapid speech that is pressured.  It is difficult to follow her.  She is very animated.  She frequently repeatedly bangs her hand on the table while talking to me in a loud voice.  She states that her nerves are reticulocyte and that she wants additional Xanax to help with her anxiety.  She states that she cannot sleep.  From an objective standpoint, I have a very agitated patient who is not sleeping.  She has very pressured speech and tangential speech.  I explained to the patient that I do not know all the aspects of her story however many aspects seem implausible and almost delusional.  Therefore I feel that the patient is most likely manic.  I explained this to her as politely as I possibly could.  Each time the patient began  shaking her head and rolling her eyes it may and is obviously very upset.  I explained to her that I only want to help her.  I am unable to assist with stolen identity or cyber stalking.  However I can better try to help manage her "nerves".  As result I would focus on managing acute mania.  Therefore I wanted her to resume the Vraylar immediately.  At that time, my plan was:  I believe the patient is dealing with delusions and mania.  I do not believe that the patient is lying.  However I do not believe that everything is happening exactly as she perceives it to be happening.  Therefore I want to focus on managing mania with Vraylar 1.5 mg daily and recheck in 1 week.  I asked the patient to return in 1 week so that we can decide whether to uptitrate the medication.  However she seems very upset and frustrated with me and leaves abruptly  11/18/20 Patient is being seen today as a telephone visit.  She consents to be seen via telephone.  In fact she request to be seen via telephone due to mechanical malfunction in her car preventing her from coming to the office.  She missed her appointment yesterday.  Phone call began at 1218.  Phone call  concluded at 1235.  Patient did not follow-up after her last office visit.  Apparently the situation worsened to the point that her bosses asked her to take a leave of absence.  Her first day off of work was November 10, 2020.  She has taken a 2-week leave of absence and would like to return to work on November 7.  She states that she has been working at her current job for the last 5 years.  She works at a [employer] as a Freight forwarder.  She enjoys her job.  However when her boss asked her to take a leave of absence, patient states that it was a realization and she started to resume the [employer].  She is currently on 1.5 mg daily.  She states that she is doing much better.  She is now sleeping well.  She denies any "panic attacks".  She states that she is coming to the  realization that she needs to "move on".  She also states that she has "given up telling her story  because she realizes how it sounds to people not in the situation".  She is eating well now.  She denies any racing thoughts.  However, based on our conversation, there is still seems to be a fixed belief that someone is trying to control and ruin her life.  However she has insight now into how that sounds to other people and she is making better decisions as far as acting on those thoughts.  It is hard to tell how much of what she reports actually occurred.  She does have 2 pages of fraudulent charges on her credit card where her identity was stolen.  She has documentation to support this.  Therefore much of what she says did occur.  However I believe that with her mania, she was also "seeing" controlling events from an unnamed individual where they may not have actually occurred as the patient was perceiving them.  I have discussed this earlier in her previous office visit.  She wants to go back to work.  She denies any racing thoughts.  She is no longer demonstrating tangential speech.  Her thoughts seem more coherent.  At that time, my plan was: I am optimistic that the patient is doing better.  I am still concerned that she reports that an individual was trying to control her life.  Instead of no longer having these thoughts, she has now got to the point where she has given up trying to convince other people that it occurred.  It is difficult to know exactly what happened and what did not happen.  Someone did still her identity and make fraudulent charges.  There is documentation of this.  However I do not feel that outside person was controlling her TV and her phones in her vehicle.  I think that this is impractical.  Patient does not deny that that occurred.  However I do feel that she is doing much better.  For these reasons I recommended trying to increase the Vraylar to 3 mg just to see how she will do.  I  do not feel that she is a danger to go back to work to herself or to anyone else at this point.  Therefore I will write her to go back to work but I would like to check in with her via telephone in 1 week to see how she is doing on the higher dose.  12/26/20 Patient is here today for follow-up of her  bipolar syndrome.  On a positive note, she continues to show better insight.  She realizes now that some of what she was experiencing were delusions and psychosis.  However she is amazed because of how real everything seemed at the time.  On a negative note, she reports nausea and vomiting on the higher dose of Vraylar.  She also reports trouble concentrating.  She is concerned that she may have ADHD.  She also continues to suffer with anxiety and she avoids social situations and believes that she has developed agoraphobia.  She is also performing online mental health assessments that have diagnosed her with PTSD as well and severe anxiety. Past Medical History:  Diagnosis Date   Arthritis    Elevated cholesterol    Hypertriglyceridemia    Neck pain    Current Outpatient Medications on File Prior to Visit  Medication Sig Dispense Refill   ALPRAZolam (XANAX) 0.5 MG tablet Take 1 tablet (0.5 mg total) by mouth 2 (two) times daily as needed for anxiety. 60 tablet 3   cariprazine (VRAYLAR) 1.5 MG capsule Take by mouth.     cholecalciferol (VITAMIN D) 1000 units tablet Take 1,000 Units by mouth daily.     fenofibrate 160 MG tablet TAKE 1 TABLET BY MOUTH EVERY DAY 90 tablet 1   Multiple Vitamin (MULTIVITAMIN) tablet Take 1 tablet by mouth daily.     No current facility-administered medications on file prior to visit.   Past Surgical History:  Procedure Laterality Date   AUGMENTATION MAMMAPLASTY     BREAST SURGERY  1996   augmentation   CERVICAL DISCECTOMY  11/2012   C5-6,C6-7 discectomy fusion and plating   ENDOMETRIAL ABLATION     POSTERIOR CERVICAL FUSION/FORAMINOTOMY N/A 07/10/2014   Procedure:  Cervical five-six, Cervical six-seven posterior fusion with instrumentation;  Surgeon: Newman Pies, MD;  Location: [facility];  Service: Neurosurgery;  Laterality: N/A;  Cervical five-six, Cervical six-seven posterior fusion with instrumentation   TUBAL LIGATION     Social History   Socioeconomic History   Marital status: Divorced    Spouse name: Not on file   Number of children: Not on file   Years of education: Not on file   Highest education level: Not on file  Occupational History   Not on file  Tobacco Use   Smoking status: Every Day    Packs/day: 0.50    Years: 15.00    Pack years: 7.50    Types: Cigarettes   Smokeless tobacco: Never  Vaping Use   Vaping Use: Never used  Substance and Sexual Activity   Alcohol use: Yes    Comment: Occas   Drug use: No   Sexual activity: Not Currently    Comment: 1st intercourse 56 yo-More than 5 partners  Other Topics Concern   Not on file  Social History Narrative   Not on file   Social Determinants of Health   Financial Resource Strain: Not on file  Food Insecurity: Not on file  Transportation Needs: Not on file  Physical Activity: Not on file  Stress: Not on file  Social Connections: Not on file  Intimate Partner Violence: Not on file     Review of Systems  All other systems reviewed and are negative.     Objective:   Physical Exam Constitutional:      Appearance: She is not ill-appearing.  Psychiatric:        Attention and Perception: Attention normal.        Mood and  Affect: Mood and affect normal. Affect is not angry.        Speech: Speech normal. Speech is not rapid and pressured.        Behavior: Behavior normal. Behavior is not agitated.        Cognition and Memory: Cognition and memory normal.        Judgment: Judgment normal.          Bipolar 1 disorder (HCC) - Plan: Ambulatory referral to Psychiatry Patient request to see a psychiatrist and I feel that this is appropriate for her.  I feel that  a psychiatrist is better equipped to manage her symptoms, I am.  It is quite possible that she is dealing with anxiety as well as ADD but I do feel that a lot of what she was experiencing was bipolar mania.  Therefore I would like her to continue the Vraylar.  I will treat her nausea with Zofran I will consult psychiatry for second opinion

## 2020-12-31 ENCOUNTER — Telehealth: Payer: Self-pay

## 2020-12-31 NOTE — Telephone Encounter (Signed)
error 

## 2020-12-31 NOTE — Telephone Encounter (Signed)
Attempted to reach patient but the phone rang and was answered but no one would speak.  I could here the tv in the background and breathing.  Per Cathlean Marseilles, NP [place] sher[facility] department was called and a well visit was requested.   Referral information: [facility]  [facility]  [address]  [place], [place]  [address]  Ph: [phone]

## 2020-12-31 NOTE — Telephone Encounter (Signed)
Pt called in inquiring about a referral that she requested at her last appt. Please advise.  Cb#: [phone]

## 2021-01-01 NOTE — Telephone Encounter (Signed)
Spoke with patient and gave behavioral health information.

## 2021-01-19 ENCOUNTER — Other Ambulatory Visit: Payer: Self-pay | Admitting: Family Medicine

## 2021-01-19 DIAGNOSIS — G4700 Insomnia, unspecified: Secondary | ICD-10-CM

## 2021-01-19 DIAGNOSIS — F419 Anxiety disorder, unspecified: Secondary | ICD-10-CM

## 2021-02-11 ENCOUNTER — Ambulatory Visit (HOSPITAL_BASED_OUTPATIENT_CLINIC_OR_DEPARTMENT_OTHER): Payer: 59 | Admitting: Psychiatry

## 2021-02-11 ENCOUNTER — Other Ambulatory Visit: Payer: Self-pay

## 2021-02-11 DIAGNOSIS — F4310 Post-traumatic stress disorder, unspecified: Secondary | ICD-10-CM | POA: Diagnosis not present

## 2021-02-11 DIAGNOSIS — F4312 Post-traumatic stress disorder, chronic: Secondary | ICD-10-CM
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
## 2021-02-12 ENCOUNTER — Ambulatory Visit (INDEPENDENT_AMBULATORY_CARE_PROVIDER_SITE_OTHER): Payer: 59 | Admitting: Nurse Practitioner

## 2021-02-12 ENCOUNTER — Encounter: Payer: Self-pay | Admitting: Nurse Practitioner

## 2021-02-12 VITALS — BP 110/78 | HR 115 | Ht 65.0 in | Wt 151.0 lb

## 2021-02-12 DIAGNOSIS — J3089 Other allergic rhinitis: Secondary | ICD-10-CM | POA: Diagnosis not present

## 2021-02-12 DIAGNOSIS — J3489 Other specified disorders of nose and nasal sinuses: Secondary | ICD-10-CM

## 2021-02-12 MED ORDER — FLUTICASONE PROPIONATE 50 MCG/ACT NA SUSP: 2.0000 | Freq: Every day | NASAL | 6 refills | Status: DC

## 2021-02-12 MED ORDER — LEVOCETIRIZINE DIHYDROCHLORIDE 5 MG PO TABS: 5.0000 mg | ORAL_TABLET | Freq: Every evening | ORAL | 1 refills | Status: DC

## 2021-02-12 NOTE — Progress Notes (Signed)
Subjective:    Patient ID: Mary Watson, female    DOB: October 13, 1965, 56 y.o.   MRN: [id number]  HPI: Mary Watson is a 56 y.o. female presenting for sinus pressure.  Chief Complaint  Patient presents with   Sinusitis   SINUS PRESSURE Onset: weeks Fever: no Cough: no Shortness of breath: no Wheezing: no Chest pain: no Chest tightness: no Chest congestion: no Nasal congestion: yes; mostly clear Runny nose: yes Post nasal drip: no Sneezing: yes Sore throat: no Swollen glands: no Sinus pressure: above eyes and nose Headache: no Face pain: no Toothache: no Ear pain: no  Ear pressure: no  Eyes red/itching:yes Eye drainage/crusting: yes ; tears Nausea: no  Vomiting: no Diarrhea: no  Change in appetite: no  Loss of taste/smell: no  Rash: no Fatigue: no Sick contacts: no Strep contacts: no  Context: fluctuating; worse at work and better when she gets home Recurrent sinusitis: no Treatments attempted: neti pot, time Relief with OTC medications: no  Of note, patient thinks she may have mold in her attic and thinks she may need some mold remediation.  She reports her symptoms are worse the minute she gets to work and improve significantly when she gets home.   Allergies  Allergen Reactions   Ciprofloxacin     vomiting    Outpatient Encounter Medications as of 02/12/2021  Medication Sig   ALPRAZolam (XANAX) 0.5 MG tablet TAKE 1 TABLET BY MOUTH 2 TIMES DAILY AS NEEDED FOR ANXIETY.   cariprazine (VRAYLAR) 1.5 MG capsule Take by mouth.   cholecalciferol (VITAMIN D) 1000 units tablet Take 1,000 Units by mouth daily.   fenofibrate 160 MG tablet TAKE 1 TABLET BY MOUTH EVERY DAY   fluticasone (FLONASE) 50 MCG/ACT nasal spray Place 2 sprays into both nostrils daily.   levocetirizine (XYZAL) 5 MG tablet Take 1 tablet (5 mg total) by mouth every evening.   Multiple Vitamin (MULTIVITAMIN) tablet Take 1 tablet by mouth daily.   ondansetron (ZOFRAN) 4 MG tablet Take 1 tablet (4  mg total) by mouth every 8 (eight) hours as needed for nausea or vomiting.   No facility-administered encounter medications on file as of 02/12/2021.    Patient Active Problem List   Diagnosis Date Noted   Hypertriglyceridemia    Smoker 02/17/2017   Insomnia 11/22/2016   Generalized anxiety disorder 11/22/2016   Cervical pseudoarthrosis (HCC) 07/10/2014   Chronic pain syndrome 06/19/2013   Chronic narcotic use 06/19/2013   Cervicalgia 06/19/2013   Cervical radiculitis 06/19/2013   Neuropathic pain 06/19/2013   Myofascial pain 06/19/2013   Chronic pain associated with significant psychosocial dysfunction 06/19/2013   Neck pain     Past Medical History:  Diagnosis Date   Arthritis    Elevated cholesterol    Hypertriglyceridemia    Neck pain     Relevant past medical, surgical, family and social history reviewed and updated as indicated. Interim medical history since our last visit reviewed.  Review of Systems Per HPI unless specifically indicated above     Objective:    BP 110/78    Pulse (!) 115    Ht 5\' 5"  (1.651 m)    Wt 151 lb (68.5 kg)    LMP  (LMP Unknown)    SpO2 97%    BMI 25.13 kg/m   Wt Readings from Last 3 Encounters:  02/12/21 151 lb (68.5 kg)  12/26/20 144 lb 3.2 oz (65.4 kg)  12/10/20 148 lb 12.8 oz (67.5 kg)  Physical Exam Vitals and nursing note reviewed.  Constitutional:      General: She is not in acute distress.    Appearance: Normal appearance. She is not toxic-appearing.  HENT:     Head: Normocephalic and atraumatic.     Right Ear: Tympanic membrane, ear canal and external ear normal.     Left Ear: Tympanic membrane, ear canal and external ear normal.     Nose: Congestion and rhinorrhea present.     Right Turbinates: Enlarged.     Left Turbinates: Enlarged.     Right Sinus: No maxillary sinus tenderness or frontal sinus tenderness.     Left Sinus: No maxillary sinus tenderness or frontal sinus tenderness.     Mouth/Throat:     Mouth:  Mucous membranes are moist.     Pharynx: Oropharynx is clear. Posterior oropharyngeal erythema present. No uvula swelling.     Tonsils: No tonsillar exudate.  Eyes:     General: No scleral icterus.    Extraocular Movements: Extraocular movements intact.  Pulmonary:     Effort: Pulmonary effort is normal. No respiratory distress.  Skin:    General: Skin is warm and dry.     Capillary Refill: Capillary refill takes less than 2 seconds.     Coloration: Skin is not jaundiced or pale.     Findings: No erythema.  Neurological:     Mental Status: She is alert and oriented to person, place, and time.     Motor: No weakness.     Gait: Gait normal.      Assessment & Plan:  1. Non-seasonal allergic rhinitis, unspecified trigger Suspect allergic rhinitis given clear nasal drainage, water/red eyes, and sneezing.  Start twice daily intranasal sterid and daily oral antihistamine.  Suspect possible allergen at work place and we discussed possible allergy testing in the future.  Notify Mary Monday if symptoms not improved.  2. Nasal drainage  - fluticasone (FLONASE) 50 MCG/ACT nasal spray; Place 2 sprays into both nostrils daily.  Dispense: 16 g; Refill: 6 - levocetirizine (XYZAL) 5 MG tablet; Take 1 tablet (5 mg total) by mouth every evening.  Dispense: 90 tablet; Refill: 1    Follow up plan: Return if symptoms worsen or fail to improve.

## 2021-02-16 ENCOUNTER — Ambulatory Visit: Payer: 59 | Admitting: Family Medicine

## 2021-02-17 ENCOUNTER — Telehealth: Payer: Self-pay

## 2021-02-17 DIAGNOSIS — B9689 Other specified bacterial agents as the cause of diseases classified elsewhere: Secondary | ICD-10-CM

## 2021-02-17 DIAGNOSIS — J019 Acute sinusitis, unspecified: Secondary | ICD-10-CM

## 2021-02-17 NOTE — Telephone Encounter (Signed)
Pt called into office stating that she was seen in office last week by the NP. Pt states NP explained that if she was not getting any better after a wk to give office a call and to ask for an antibiotic. Pt states that she is having no relief of her symptoms. Please advise.  Cb#: [phone]

## 2021-02-18 MED ORDER — AMOXICILLIN-POT CLAVULANATE 875-125 MG PO TABS: 1.0000 | ORAL_TABLET | Freq: Two times a day (BID) | ORAL | 0 refills | Status: AC

## 2021-02-18 NOTE — Telephone Encounter (Signed)
LMTRC

## 2021-02-18 NOTE — Telephone Encounter (Signed)
Augmentin sent into pharmacy for sinus infection

## 2021-02-23 ENCOUNTER — Telehealth: Payer: Self-pay | Admitting: Family Medicine

## 2021-02-23 DIAGNOSIS — S129XXA Fracture of neck, unspecified, initial encounter: Secondary | ICD-10-CM
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
## 2021-02-24 NOTE — Telephone Encounter (Signed)
Referral to [facility] placed.   LMTRC
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
## 2021-02-26 NOTE — Telephone Encounter (Signed)
LMTRC

## 2021-02-27 NOTE — Telephone Encounter (Signed)
LMTRC  As pt has not returned call and has not scheduled OV this message and the associated referral will be closed.

## 2021-04-01 ENCOUNTER — Ambulatory Visit (HOSPITAL_COMMUNITY): Payer: 59 | Admitting: Psychiatry

## 2021-04-01 ENCOUNTER — Other Ambulatory Visit: Payer: Self-pay

## 2021-04-01 DIAGNOSIS — F4323 Adjustment disorder with mixed anxiety and depressed mood: Secondary | ICD-10-CM | POA: Diagnosis not present
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
## 2021-04-02 ENCOUNTER — Telehealth: Payer: Self-pay | Admitting: Family Medicine

## 2021-04-02 ENCOUNTER — Telehealth (HOSPITAL_COMMUNITY): Payer: Self-pay

## 2021-04-02 NOTE — Telephone Encounter (Signed)
Spoke with patient and she states Dr. Casimiro Needle referred her to the [facility]. She would like to find another similar location that would accept her insurance.  ? ?Advised patient to call Dr. Milana Obey office for assistance, as we did not place the referral to the [facility] and would now know what his intention was with this referral.  ? ?She will contact his office. Nothing further needed at this time.  ? ?

## 2021-04-02 NOTE — Telephone Encounter (Signed)
Patient called and stated that the [facility] she was referred to does not take her insurance because it's a private insurance and that it would be 100% copay on her part. It would cost her $100 per visit. They also told her that they don't have any grants available. She cannot afford that so she's looking for alternatives. Please review and advise  ?

## 2021-04-02 NOTE — Telephone Encounter (Signed)
Patient called to follow up on referral to [facility]; fee is $100 per visit out of pocket since they don't accept private insurance. Patient requesting referral to a different provider who accepts her insurance.  ? ?Please advise at [phone]. During business hours, please advise at [phone]. ?

## 2021-04-14 NOTE — Telephone Encounter (Signed)
INFORMED/RELAYED MESSAGE TO DR. Casimiro Needle ?

## 2021-05-14 ENCOUNTER — Other Ambulatory Visit: Payer: Self-pay

## 2021-05-14 DIAGNOSIS — G4700 Insomnia, unspecified: Secondary | ICD-10-CM

## 2021-05-14 DIAGNOSIS — F419 Anxiety disorder, unspecified: Secondary | ICD-10-CM

## 2021-05-14 MED ORDER — ALPRAZOLAM 0.5 MG PO TABS: 0.5000 mg | ORAL_TABLET | Freq: Two times a day (BID) | ORAL | 3 refills | Status: DC | PRN

## 2021-05-14 NOTE — Telephone Encounter (Signed)
LOV 12/26/20 ?Last refill 01/20/21, #60, 3 refills ? ?Please review, thanks! ? ? ?

## 2021-06-01 ENCOUNTER — Ambulatory Visit (INDEPENDENT_AMBULATORY_CARE_PROVIDER_SITE_OTHER): Payer: 59 | Admitting: Family Medicine

## 2021-06-01 VITALS — BP 132/82 | HR 100 | Temp 98.1°F | Ht 65.0 in | Wt 157.8 lb

## 2021-06-01 DIAGNOSIS — M542 Cervicalgia: Secondary | ICD-10-CM | POA: Diagnosis not present
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
## 2021-06-03 ENCOUNTER — Ambulatory Visit
Admission: RE | Admit: 2021-06-03 | Discharge: 2021-06-03 | Disposition: A | Discharge status: Home / Self Care | Payer: Managed Care, Other (non HMO) | Source: Ambulatory Visit | Attending: Family Medicine | Admitting: Family Medicine

## 2021-06-03 DIAGNOSIS — M542 Cervicalgia: Secondary | ICD-10-CM

## 2021-06-08 ENCOUNTER — Other Ambulatory Visit: Payer: Self-pay

## 2021-06-08 DIAGNOSIS — M542 Cervicalgia: Secondary | ICD-10-CM

## 2021-06-08 DIAGNOSIS — S129XXA Fracture of neck, unspecified, initial encounter: Secondary | ICD-10-CM

## 2021-06-08 NOTE — Progress Notes (Unsigned)
o

## 2021-06-09 ENCOUNTER — Other Ambulatory Visit: Payer: Self-pay | Admitting: Family Medicine

## 2021-06-10 NOTE — Telephone Encounter (Signed)
Requested medication (s) are due for refill today: Yes  Requested medication (s) are on the active medication list: Yes  Last refill:  08/22/20  Future visit scheduled: No  Notes to clinic:  Protocol indicates pt. Needs lab work.    Requested Prescriptions  Pending Prescriptions Disp Refills   fenofibrate 160 MG tablet [Pharmacy Med Name: FENOFIBRATE 160 MG TABLET] 30 tablet 5    Sig: TAKE 1 TABLET BY MOUTH EVERY DAY     Cardiovascular:  Antilipid - Fibric Acid Derivatives Failed - 06/09/2021  2:33 PM      Failed - HGB in normal range and within 360 days    Hemoglobin  Date Value Ref Range Status  02/17/2017 13.4 11.7 - 15.5 g/dL Final         Failed - HCT in normal range and within 360 days    HCT  Date Value Ref Range Status  02/17/2017 38.3 35.0 - 45.0 % Final         Failed - PLT in normal range and within 360 days    Platelets  Date Value Ref Range Status  02/17/2017 241 140 - 400 Thousand/uL Final         Failed - WBC in normal range and within 360 days    WBC  Date Value Ref Range Status  02/17/2017 6.6 3.8 - 10.8 Thousand/uL Final         Failed - Lipid Panel in normal range within the last 12 months    Cholesterol  Date Value Ref Range Status  09/02/2020 256 (H) <200 mg/dL Final   LDL Cholesterol (Calc)  Date Value Ref Range Status  09/02/2020 146 (H) mg/dL (calc) Final    Comment:    Reference range: <100 . Desirable range <100 mg/dL for primary prevention;   <70 mg/dL for patients with CHD or diabetic patients  with > or = 2 CHD risk factors. Marland Kitchen LDL-C is now calculated using the Martin-Hopkins  calculation, which is a validated novel method providing  better accuracy than the Friedewald equation in the  estimation of LDL-C.  Cresenciano Genre et al. Annamaria Helling. 1610;960(45): 2061-2068  (http://education.[facility].com/faq/FAQ164)    HDL  Date Value Ref Range Status  09/02/2020 71 > OR = 50 mg/dL Final   Triglycerides  Date Value Ref Range Status   09/02/2020 252 (H) <150 mg/dL Final    Comment:    . If a non-fasting specimen was collected, consider repeat triglyceride testing on a fasting specimen if clinically indicated.  Yates Decamp et al. J. of Clin. Lipidol. 4098;1:191-478. Marland Kitchen          Passed - ALT in normal range and within 360 days    ALT  Date Value Ref Range Status  09/02/2020 16 6 - 29 U/L Final         Passed - AST in normal range and within 360 days    AST  Date Value Ref Range Status  09/02/2020 16 10 - 35 U/L Final         Passed - Cr in normal range and within 360 days    Creat  Date Value Ref Range Status  09/02/2020 0.64 0.50 - 1.03 mg/dL Final         Passed - eGFR is 30 or above and within 360 days    GFR, Est African American  Date Value Ref Range Status  08/22/2018 123 > OR = 60 mL/min/1.56m2 Final   GFR, Est Non African American  Date Value  Ref Range Status  08/22/2018 106 > OR = 60 mL/min/1.73m2 Final   eGFR  Date Value Ref Range Status  09/02/2020 106 > OR = 60 mL/min/1.31m2 Final    Comment:    The eGFR is based on the CKD-EPI 2021 equation. To calculate  the new eGFR from a previous Creatinine or Cystatin C result, go to https://www.kidney.org/professionals/ kdoqi/gfr%5Fcalculator          Passed - Valid encounter within last 12 months    Recent Outpatient Visits           1 week ago Neck pain   [place] [facility] Schaumann, Cammie Mcgee, MD   3 months ago Non-seasonal allergic rhinitis, unspecified trigger   [place] Eulogio Bear, NP   5 months ago Bipolar 1 disorder [facility])   [place] Pickard, Cammie Mcgee, MD   6 months ago Breast pain   [place], Jessica A, NP   6 months ago Bipolar 1 disorder ([place])   [facility] Pickard, Cammie Mcgee, MD

## 2021-06-24 ENCOUNTER — Ambulatory Visit (INDEPENDENT_AMBULATORY_CARE_PROVIDER_SITE_OTHER): Payer: PRIVATE HEALTH INSURANCE | Admitting: Physical Therapy

## 2021-06-24 ENCOUNTER — Encounter: Payer: Self-pay | Admitting: Physical Therapy

## 2021-06-24 DIAGNOSIS — R293 Abnormal posture: Secondary | ICD-10-CM | POA: Diagnosis not present

## 2021-06-24 DIAGNOSIS — M6281 Muscle weakness (generalized): Secondary | ICD-10-CM

## 2021-06-24 DIAGNOSIS — M542 Cervicalgia: Secondary | ICD-10-CM | POA: Diagnosis not present
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
## 2021-07-08 ENCOUNTER — Encounter: Payer: Self-pay | Admitting: Physical Therapy

## 2021-07-08 ENCOUNTER — Ambulatory Visit (INDEPENDENT_AMBULATORY_CARE_PROVIDER_SITE_OTHER): Payer: PRIVATE HEALTH INSURANCE | Admitting: Physical Therapy

## 2021-07-08 DIAGNOSIS — R293 Abnormal posture: Secondary | ICD-10-CM | POA: Diagnosis not present

## 2021-07-08 DIAGNOSIS — M542 Cervicalgia: Secondary | ICD-10-CM | POA: Diagnosis not present

## 2021-07-08 DIAGNOSIS — M6281 Muscle weakness (generalized): Secondary | ICD-10-CM
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
## 2021-07-27 ENCOUNTER — Encounter: Payer: PRIVATE HEALTH INSURANCE | Admitting: Physical Therapy

## 2021-07-27 ENCOUNTER — Telehealth: Payer: Self-pay | Admitting: Physical Therapy

## 2021-07-27 NOTE — Telephone Encounter (Signed)
I called pt to follow up after she didn't show for her 9:30 appointment this morning. Pt stating she has been sick over the weekend and forgot to cancel her appointment. Pt did report having a great week last week on vacation with no pain reported. Pt stating, "that has been the longest time she has been without pain that she could remember".  Pt feels she doesn't need to make any additional follow up appointments. Pt was instructed to call back and schedule another appointment if her pain returns after returning to work.   Narda Amber, PT, MPT 07/27/21 10:05 AM

## 2021-08-14 ENCOUNTER — Other Ambulatory Visit: Payer: Self-pay | Admitting: Family Medicine

## 2021-08-14 NOTE — Telephone Encounter (Signed)
Requested medication (s) are due for refill today: yes  Requested medication (s) are on the active medication list: yes  Last refill:  12/26/20  Future visit scheduled: no  Notes to clinic:  Unable to refill per protocol, cannot delegate.      Requested Prescriptions  Pending Prescriptions Disp Refills   ondansetron (ZOFRAN) 4 MG tablet [Pharmacy Med Name: ONDANSETRON HCL 4 MG TABLET] 9 tablet 2    Sig: TAKE 1 TABLET BY MOUTH EVERY 8 HOURS AS NEEDED FOR NAUSEA AND VOMITING. PLAN LIMITS=9/30 DAYS     Not Delegated - Gastroenterology: Antiemetics - ondansetron Failed - 08/14/2021 10:45 AM      Failed - This refill cannot be delegated      Passed - AST in normal range and within 360 days    AST  Date Value Ref Range Status  09/02/2020 16 10 - 35 U/L Final         Passed - ALT in normal range and within 360 days    ALT  Date Value Ref Range Status  09/02/2020 16 6 - 29 U/L Final         Passed - Valid encounter within last 6 months    Recent Outpatient Visits           2 months ago Neck pain   [facility] Donita Brooks, MD   6 months ago Non-seasonal allergic rhinitis, unspecified trigger   [facility] Valentino Nose, NP   7 months ago Bipolar 1 disorder (HCC)   [facility] Pickard, Priscille Heidelberg, MD   8 months ago Breast pain   [facility] Valentino Nose, NP   8 months ago Bipolar 1 disorder (HCC)   [facility] Pickard, Priscille Heidelberg, MD

## 2021-09-14 ENCOUNTER — Other Ambulatory Visit: Payer: Self-pay

## 2021-09-14 DIAGNOSIS — F419 Anxiety disorder, unspecified: Secondary | ICD-10-CM

## 2021-09-14 DIAGNOSIS — G4700 Insomnia, unspecified: Secondary | ICD-10-CM

## 2021-09-14 MED ORDER — ALPRAZOLAM 0.5 MG PO TABS: 0.5000 mg | ORAL_TABLET | Freq: Two times a day (BID) | ORAL | 3 refills | Status: AC | PRN

## 2021-09-14 NOTE — Telephone Encounter (Signed)
LOV 06/01/21 Last refill 05/14/21, #60, 3 refills  Please review, thanks!

## 2021-10-29 ENCOUNTER — Ambulatory Visit (INDEPENDENT_AMBULATORY_CARE_PROVIDER_SITE_OTHER): Payer: PRIVATE HEALTH INSURANCE | Admitting: Family Medicine

## 2021-10-29 VITALS — BP 140/86 | HR 103 | Temp 98.2°F | Ht 65.0 in | Wt 154.0 lb

## 2021-10-29 DIAGNOSIS — I10 Essential (primary) hypertension: Secondary | ICD-10-CM | POA: Diagnosis not present

## 2021-10-29 DIAGNOSIS — E781 Pure hyperglyceridemia: Secondary | ICD-10-CM

## 2021-10-29 MED ORDER — AMLODIPINE BESYLATE 5 MG PO TABS: 5.0000 mg | ORAL_TABLET | Freq: Every day | ORAL | 3 refills | Status: AC

## 2021-10-29 NOTE — Progress Notes (Signed)
Subjective:    Patient ID: Mary Watson, female    DOB: 07/15/1965, 56 y.o.   MRN: [id number]  Hypertension  Patient states that over the summer she has been checking her blood pressure and finding an elevated.  At home she is seeing it as high as 151 and 761 systolic.  Here today her systolic blood pressure is 140.  She denies any chest pain shortness of breath or dyspnea on exertion.  Her psychiatrist recently checked her renal function and it was "normal".  She denies eating excessive sodium.  She is under stress at work.  Past Medical History:  Diagnosis Date   Arthritis    Elevated cholesterol    Hypertriglyceridemia    Neck pain    Current Outpatient Medications on File Prior to Visit  Medication Sig Dispense Refill   ALPRAZolam (XANAX) 0.5 MG tablet Take 1 tablet (0.5 mg total) by mouth 2 (two) times daily as needed for anxiety. 60 tablet 3   amphetamine-dextroamphetamine (ADDERALL) 7.5 MG tablet Take 1 tablet by mouth daily.     cariprazine (VRAYLAR) 1.5 MG capsule Take by mouth.     cholecalciferol (VITAMIN D) 1000 units tablet Take 1,000 Units by mouth daily.     fenofibrate 160 MG tablet TAKE 1 TABLET BY MOUTH EVERY DAY 30 tablet 5   lithium carbonate (LITHOBID) 300 MG CR tablet Take 300 mg by mouth 2 (two) times daily.     Multiple Vitamin (MULTIVITAMIN) tablet Take 1 tablet by mouth daily.     ondansetron (ZOFRAN) 4 MG tablet TAKE 1 TABLET BY MOUTH EVERY 8 HOURS AS NEEDED FOR NAUSEA AND VOMITING. PLAN LIMITS=9/30 DAYS 9 tablet 2   QUEtiapine (SEROQUEL XR) 50 MG TB24 24 hr tablet Take 100 mg by mouth at bedtime.     fluticasone (FLONASE) 50 MCG/ACT nasal spray Place 2 sprays into both nostrils daily. (Patient not taking: Reported on 10/29/2021) 16 g 6   levocetirizine (XYZAL) 5 MG tablet Take 1 tablet (5 mg total) by mouth every evening. (Patient not taking: Reported on 10/29/2021) 90 tablet 1   No current facility-administered medications on file prior to visit.   Past  Surgical History:  Procedure Laterality Date   AUGMENTATION MAMMAPLASTY     BREAST SURGERY  1996   augmentation   CERVICAL DISCECTOMY  11/2012   C5-6,C6-7 discectomy fusion and plating   ENDOMETRIAL ABLATION     POSTERIOR CERVICAL FUSION/FORAMINOTOMY N/A 07/10/2014   Procedure: Cervical five-six, Cervical six-seven posterior fusion with instrumentation;  Surgeon: Newman Pies, MD;  Location: [place] NEURO ORS;  Service: Neurosurgery;  Laterality: N/A;  Cervical five-six, Cervical six-seven posterior fusion with instrumentation   TUBAL LIGATION     Social History   Socioeconomic History   Marital status: Divorced    Spouse name: Not on file   Number of children: Not on file   Years of education: Not on file   Highest education level: Not on file  Occupational History   Not on file  Tobacco Use   Smoking status: Every Day    Packs/day: 0.50    Years: 15.00    Total pack years: 7.50    Types: Cigarettes   Smokeless tobacco: Never  Vaping Use   Vaping Use: Never used  Substance and Sexual Activity   Alcohol use: Yes    Comment: Occas   Drug use: No   Sexual activity: Not Currently    Comment: 1st intercourse 56 yo-More than 5 partners  Other Topics  Concern   Not on file  Social History Narrative   Not on file   Social Determinants of Health   Financial Resource Strain: Not on file  Food Insecurity: Not on file  Transportation Needs: Not on file  Physical Activity: Not on file  Stress: Not on file  Social Connections: Not on file  Intimate Partner Violence: Not on file     Review of Systems  All other systems reviewed and are negative.      Objective:   Physical Exam Constitutional:      Appearance: She is not ill-appearing.  Psychiatric:        Attention and Perception: Attention normal.        Mood and Affect: Mood and affect normal. Affect is not angry.        Speech: Speech normal. Speech is not rapid and pressured.        Behavior: Behavior normal.  Behavior is not agitated.        Cognition and Memory: Cognition and memory normal.        Judgment: Judgment normal.           Hypertriglyceridemia - Plan: CBC with Differential/Platelet, Lipid panel, COMPLETE METABOLIC PANEL WITH GFR  Benign essential HTN I would like her to return fasting so I can check a CBC a CMP and a lipid panel.  Meanwhile begin amlodipine 5 mg daily and recheck blood pressure in 2 weeks

## 2021-11-12 ENCOUNTER — Other Ambulatory Visit: Payer: Self-pay | Admitting: Nurse Practitioner

## 2021-11-12 DIAGNOSIS — Z1231 Encounter for screening mammogram for malignant neoplasm of breast: Secondary | ICD-10-CM

## 2021-12-18 DEATH — deceased

## 2022-01-07 ENCOUNTER — Ambulatory Visit: Payer: PRIVATE HEALTH INSURANCE

## 2023-03-19 IMAGING — MG MM  DIGITAL DIAGNOSTIC BREAST BILAT IMPLANT W/ TOMO W/ CAD
8 of 14 series · 8 of 34 positions shown · non-contrast
Comparison: Previous exam(s).

CLINICAL DATA: 55-year-old female with focal pain and "pop" in the
UPPER RIGHT breast during reaching with her arm. Pain has improved.
Also for annual bilateral mammogram.

EXAM:
DIGITAL DIAGNOSTIC BILATERAL MAMMOGRAM WITH IMPLANTS, CAD AND
TOMOSYNTHESIS; ULTRASOUND RIGHT BREAST LIMITED
TECHNIQUE: Bilateral digital diagnostic mammography and breast tomosynthesis
was performed. The images were evaluated with computer-aided
detection. Standard and/or implant displaced views were performed.;
Targeted ultrasound examination of the right breast was performed

[R CC]
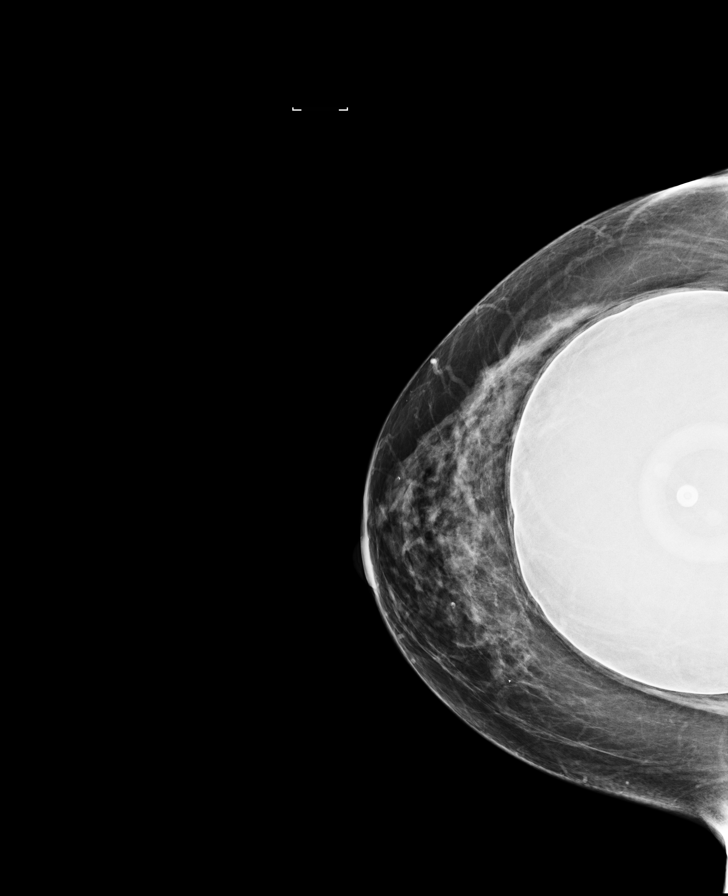

[L MLO]
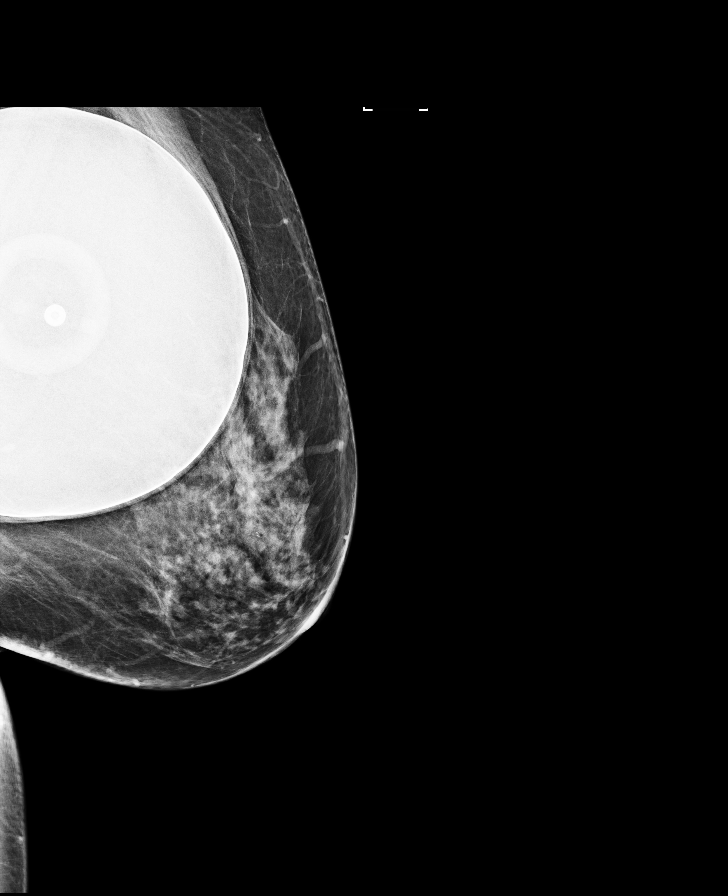

[R MLO]
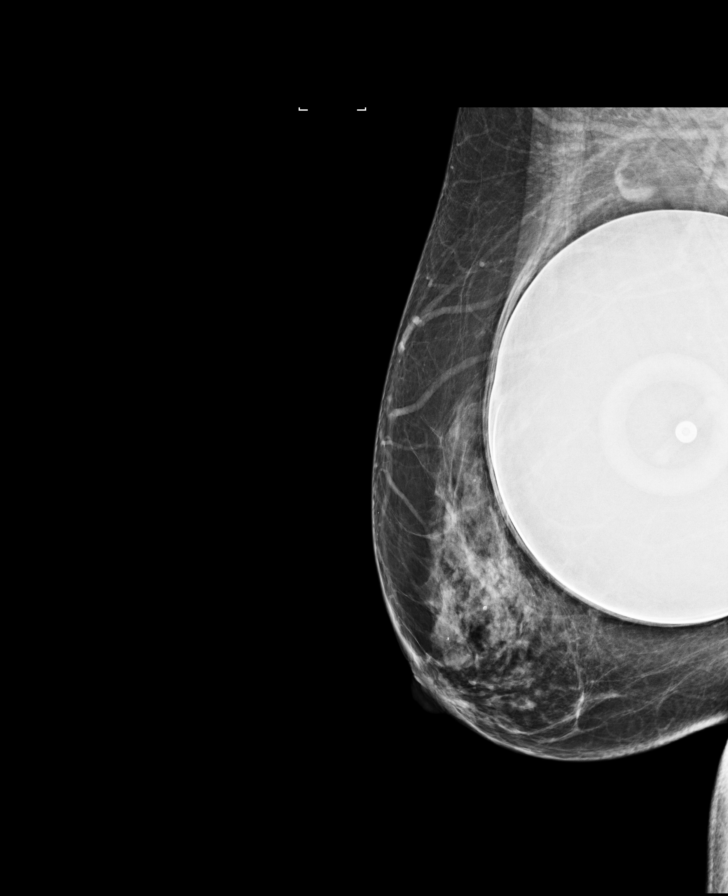

[L CC]
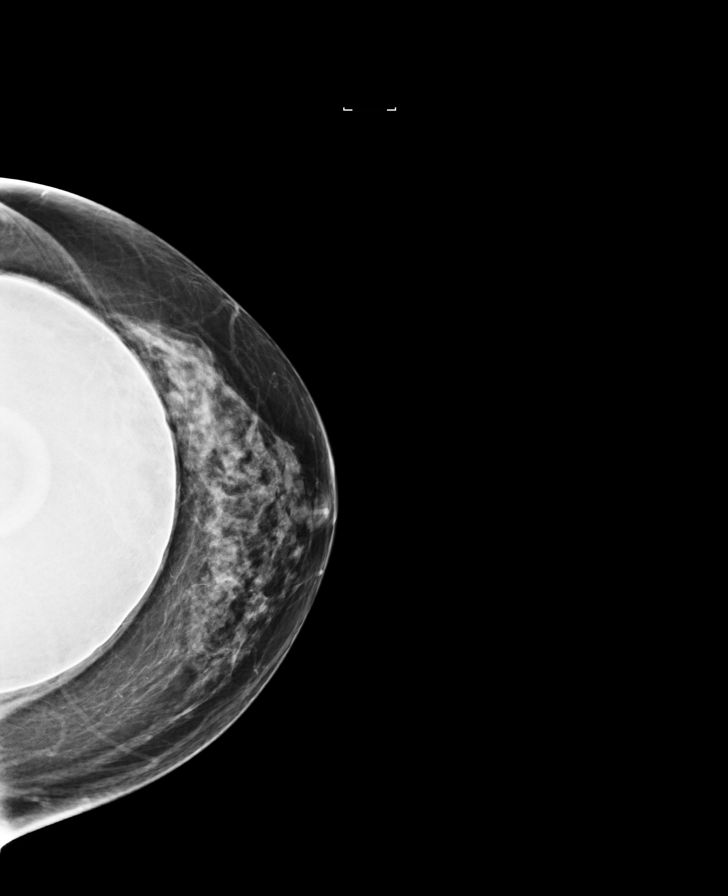

[R MLO synth-2D (1 of 2)]
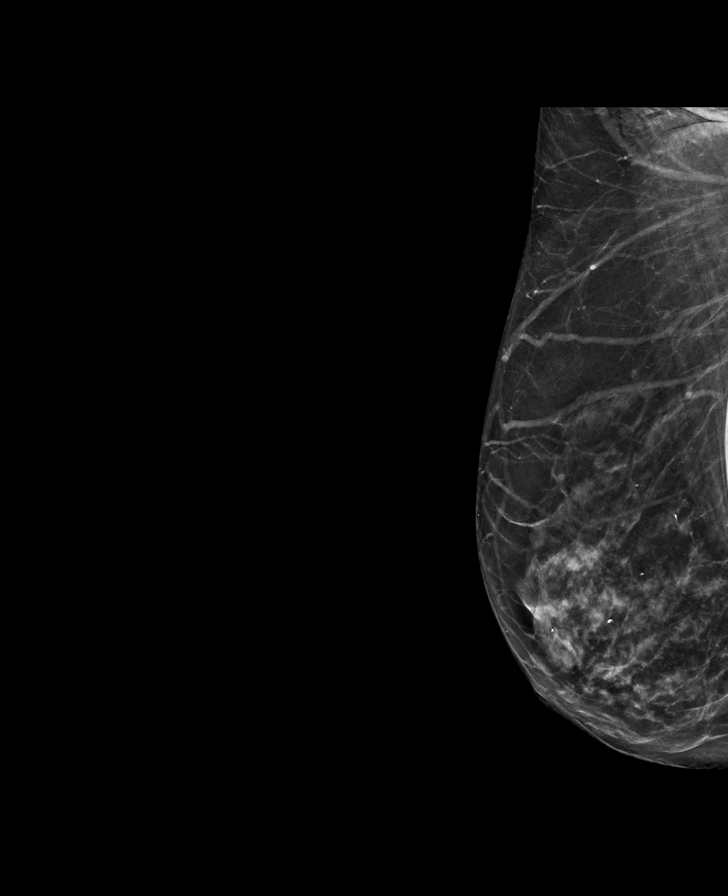

[L CC synth-2D]
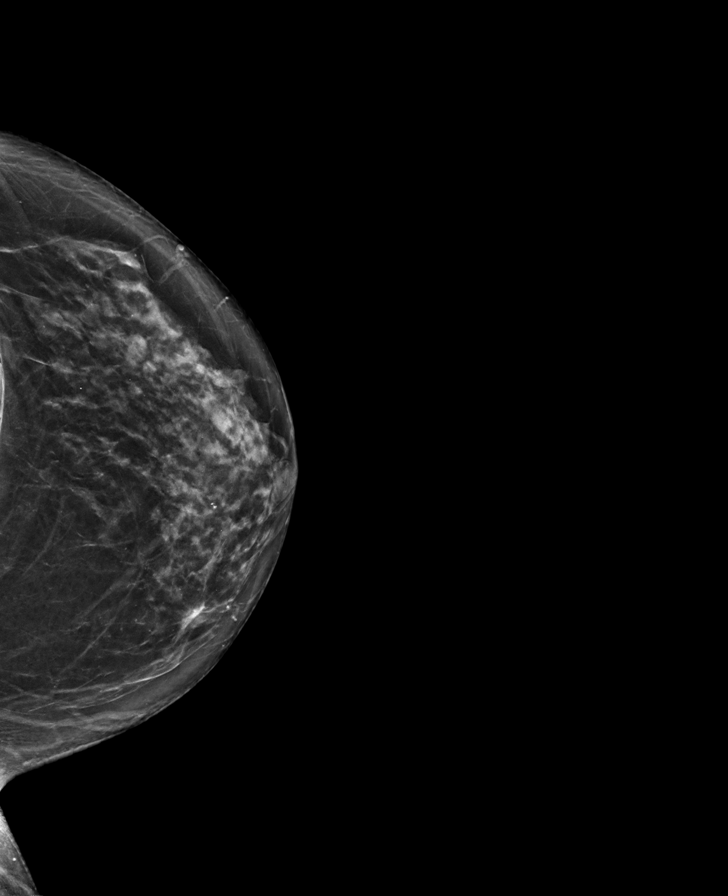

[R MLO synth-2D (2 of 2)]
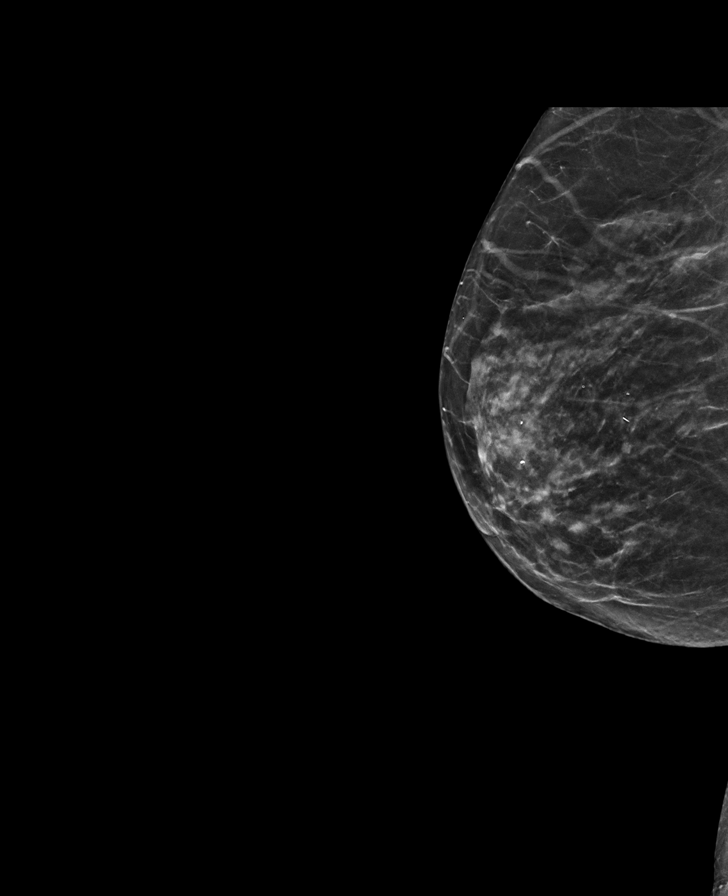

[L MLO synth-2D]
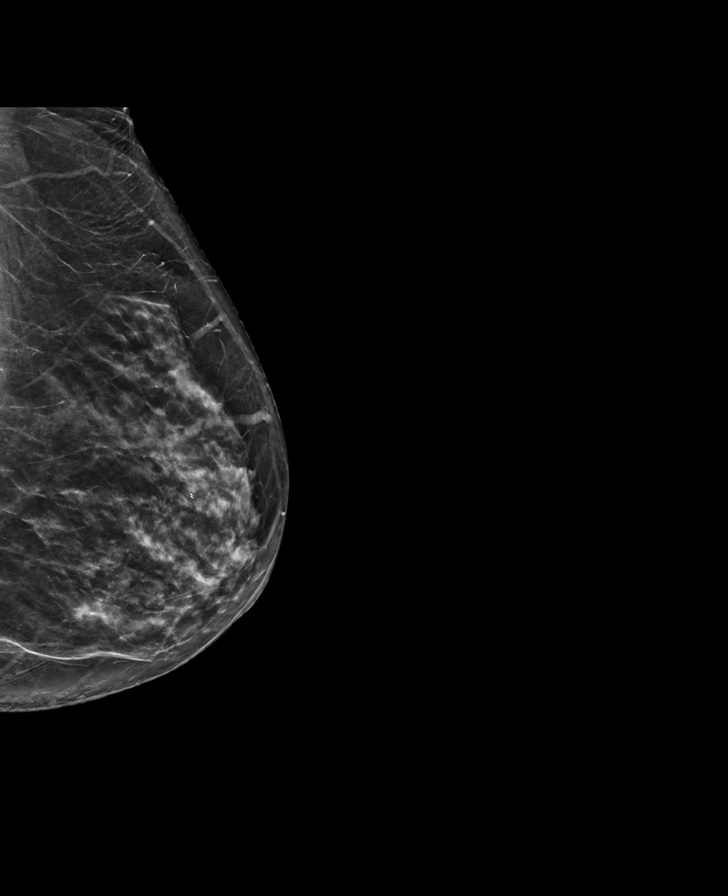

[8 of 34 positions shown; findings below may reference images not displayed]

ACR Breast Density Category c: The breast tissue is heterogeneously
dense, which may obscure small masses.
FINDINGS: 2D/3D full field views of both breasts demonstrate no suspicious
mass, distortion or worrisome calcifications. The patient has
retropectoral saline implants.

On physical exam, no palpable abnormalities are identified the UPPER
RIGHT breast, in the area of patient's pain.

Targeted ultrasound is performed, showing no sonographic
abnormalities in the UPPER RIGHT breast.
IMPRESSION: 1. No mammographic, palpable or sonographic abnormality within the
UPPER RIGHT breast, in the area of patient's pain.
2. No mammographic evidence of breast malignancy.

RECOMMENDATION:
Bilateral screening mammogram in 1 year.

I have discussed the findings and recommendations with the patient.
If applicable, a reminder letter will be sent to the patient
regarding the next appointment.

BI-RADS CATEGORY  2: Benign.
